# Patient Record
Sex: Female | Born: 1970 | Race: White | Hispanic: No | Marital: Married | Smoking: Never smoker
Health system: Southern US, Community
[De-identification: ages and names within clinical notes are randomized; demographics above are authoritative.]

## PROBLEM LIST (undated history)

## (undated) DIAGNOSIS — J302 Other seasonal allergic rhinitis: Secondary | ICD-10-CM

## (undated) DIAGNOSIS — B9681 Helicobacter pylori [H. pylori] as the cause of diseases classified elsewhere: Secondary | ICD-10-CM

## (undated) DIAGNOSIS — R51 Headache: Secondary | ICD-10-CM

## (undated) DIAGNOSIS — D649 Anemia, unspecified: Secondary | ICD-10-CM

## (undated) DIAGNOSIS — N200 Calculus of kidney: Secondary | ICD-10-CM

## (undated) DIAGNOSIS — K649 Unspecified hemorrhoids: Secondary | ICD-10-CM

## (undated) DIAGNOSIS — I639 Cerebral infarction, unspecified: Secondary | ICD-10-CM

## (undated) DIAGNOSIS — K289 Gastrojejunal ulcer, unspecified as acute or chronic, without hemorrhage or perforation: Secondary | ICD-10-CM

## (undated) DIAGNOSIS — N6019 Diffuse cystic mastopathy of unspecified breast: Secondary | ICD-10-CM

## (undated) DIAGNOSIS — A029 Salmonella infection, unspecified: Secondary | ICD-10-CM

## (undated) HISTORY — PX: NOVASURE ABLATION: SHX5394

## (undated) HISTORY — PX: LITHOTRIPSY: SUR834

## (undated) HISTORY — DX: Calculus of kidney: N20.0

## (undated) HISTORY — DX: Diffuse cystic mastopathy of unspecified breast: N60.19

## (undated) HISTORY — DX: Other seasonal allergic rhinitis: J30.2

## (undated) HISTORY — DX: Unspecified hemorrhoids: K64.9

## (undated) HISTORY — DX: Cerebral infarction, unspecified: I63.9

## (undated) HISTORY — DX: Headache: R51

## (undated) HISTORY — DX: Salmonella infection, unspecified: A02.9

## (undated) HISTORY — DX: Helicobacter pylori (H. pylori) as the cause of diseases classified elsewhere: B96.81

## (undated) HISTORY — DX: Gastrojejunal ulcer, unspecified as acute or chronic, without hemorrhage or perforation: K28.9

## (undated) HISTORY — PX: TUBAL LIGATION: SHX77

---

## 2005-04-01 ENCOUNTER — Observation Stay: Payer: Self-pay | Admitting: Obstetrics and Gynecology

## 2005-04-27 ENCOUNTER — Inpatient Hospital Stay: Payer: Self-pay | Admitting: Obstetrics and Gynecology

## 2006-08-15 ENCOUNTER — Emergency Department: Payer: Self-pay | Admitting: Emergency Medicine

## 2007-04-14 ENCOUNTER — Emergency Department: Payer: Self-pay | Admitting: Internal Medicine

## 2008-07-06 ENCOUNTER — Ambulatory Visit: Payer: Self-pay

## 2008-12-11 ENCOUNTER — Emergency Department: Payer: Self-pay | Admitting: Emergency Medicine

## 2008-12-24 ENCOUNTER — Ambulatory Visit: Payer: Self-pay | Admitting: Gastroenterology

## 2008-12-31 ENCOUNTER — Ambulatory Visit: Payer: Self-pay | Admitting: Gastroenterology

## 2009-02-23 DIAGNOSIS — G459 Transient cerebral ischemic attack, unspecified: Secondary | ICD-10-CM

## 2009-02-23 HISTORY — DX: Transient cerebral ischemic attack, unspecified: G45.9

## 2009-08-27 ENCOUNTER — Ambulatory Visit: Payer: Self-pay | Admitting: Gastroenterology

## 2009-09-06 ENCOUNTER — Ambulatory Visit: Payer: Self-pay | Admitting: Gastroenterology

## 2009-09-10 LAB — PATHOLOGY REPORT

## 2009-11-11 ENCOUNTER — Inpatient Hospital Stay: Payer: Self-pay | Admitting: Internal Medicine

## 2009-11-19 ENCOUNTER — Encounter: Payer: Self-pay | Admitting: Neurology

## 2009-11-23 ENCOUNTER — Encounter: Payer: Self-pay | Admitting: Neurology

## 2010-03-18 ENCOUNTER — Ambulatory Visit: Payer: Self-pay | Admitting: Internal Medicine

## 2010-05-13 ENCOUNTER — Ambulatory Visit: Payer: Self-pay | Admitting: Neurology

## 2010-08-01 ENCOUNTER — Ambulatory Visit: Payer: Self-pay | Admitting: Gastroenterology

## 2010-11-19 ENCOUNTER — Other Ambulatory Visit: Payer: Self-pay | Admitting: Gastroenterology

## 2010-12-02 ENCOUNTER — Ambulatory Visit: Payer: Self-pay | Admitting: Gastroenterology

## 2010-12-04 LAB — PATHOLOGY REPORT

## 2010-12-12 ENCOUNTER — Ambulatory Visit: Payer: Self-pay | Admitting: Gastroenterology

## 2011-07-14 ENCOUNTER — Ambulatory Visit: Payer: Self-pay | Admitting: Obstetrics and Gynecology

## 2012-07-26 ENCOUNTER — Ambulatory Visit: Payer: Self-pay

## 2012-07-27 ENCOUNTER — Ambulatory Visit: Payer: Self-pay

## 2012-08-09 ENCOUNTER — Ambulatory Visit: Payer: Self-pay | Admitting: Surgery

## 2012-08-16 ENCOUNTER — Ambulatory Visit: Payer: Self-pay | Admitting: Surgery

## 2012-08-16 HISTORY — PX: BREAST BIOPSY: SHX20

## 2012-09-11 ENCOUNTER — Emergency Department: Payer: Self-pay | Admitting: Emergency Medicine

## 2013-08-03 DIAGNOSIS — I639 Cerebral infarction, unspecified: Secondary | ICD-10-CM | POA: Insufficient documentation

## 2013-12-22 ENCOUNTER — Ambulatory Visit: Payer: Self-pay | Admitting: Physician Assistant

## 2014-02-14 ENCOUNTER — Ambulatory Visit: Payer: Self-pay

## 2014-07-13 ENCOUNTER — Other Ambulatory Visit: Payer: Self-pay | Admitting: Internal Medicine

## 2014-07-13 DIAGNOSIS — R1031 Right lower quadrant pain: Secondary | ICD-10-CM

## 2014-07-20 ENCOUNTER — Ambulatory Visit
Admission: RE | Admit: 2014-07-20 | Discharge: 2014-07-20 | Disposition: A | Discharge status: Home / Self Care | Payer: BLUE CROSS/BLUE SHIELD | Source: Ambulatory Visit | Attending: Internal Medicine | Admitting: Internal Medicine

## 2014-07-20 DIAGNOSIS — N839 Noninflammatory disorder of ovary, fallopian tube and broad ligament, unspecified: Secondary | ICD-10-CM | POA: Diagnosis not present

## 2014-07-20 DIAGNOSIS — R1031 Right lower quadrant pain: Secondary | ICD-10-CM | POA: Insufficient documentation

## 2014-07-20 DIAGNOSIS — Z0000 Encounter for general adult medical examination without abnormal findings: Secondary | ICD-10-CM | POA: Diagnosis present

## 2014-07-20 MED ORDER — IOHEXOL 300 MG/ML  SOLN
100.0000 mL | Freq: Once | INTRAMUSCULAR | Status: AC | PRN
  Administered 2014-07-20: 100 mL via INTRAVENOUS

## 2014-09-17 ENCOUNTER — Ambulatory Visit (INDEPENDENT_AMBULATORY_CARE_PROVIDER_SITE_OTHER): Payer: Self-pay | Admitting: Urology

## 2014-09-17 ENCOUNTER — Encounter: Payer: Self-pay | Admitting: Urology

## 2014-09-17 VITALS — BP 113/78 | HR 88 | Resp 18 | Ht 63.0 in | Wt 132.3 lb

## 2014-09-17 DIAGNOSIS — B9681 Helicobacter pylori [H. pylori] as the cause of diseases classified elsewhere: Secondary | ICD-10-CM

## 2014-09-17 DIAGNOSIS — J302 Other seasonal allergic rhinitis: Secondary | ICD-10-CM

## 2014-09-17 DIAGNOSIS — R310 Gross hematuria: Secondary | ICD-10-CM | POA: Insufficient documentation

## 2014-09-17 DIAGNOSIS — R51 Headache: Secondary | ICD-10-CM

## 2014-09-17 DIAGNOSIS — K649 Unspecified hemorrhoids: Secondary | ICD-10-CM

## 2014-09-17 DIAGNOSIS — K289 Gastrojejunal ulcer, unspecified as acute or chronic, without hemorrhage or perforation: Secondary | ICD-10-CM

## 2014-09-17 DIAGNOSIS — A029 Salmonella infection, unspecified: Secondary | ICD-10-CM

## 2014-09-17 DIAGNOSIS — R109 Unspecified abdominal pain: Secondary | ICD-10-CM

## 2014-09-17 DIAGNOSIS — R519 Headache, unspecified: Secondary | ICD-10-CM

## 2014-09-17 DIAGNOSIS — N6019 Diffuse cystic mastopathy of unspecified breast: Secondary | ICD-10-CM

## 2014-09-17 DIAGNOSIS — N200 Calculus of kidney: Secondary | ICD-10-CM

## 2014-09-17 HISTORY — DX: Headache, unspecified: R51.9

## 2014-09-17 HISTORY — DX: Salmonella infection, unspecified: A02.9

## 2014-09-17 HISTORY — DX: Diffuse cystic mastopathy of unspecified breast: N60.19

## 2014-09-17 HISTORY — DX: Other seasonal allergic rhinitis: J30.2

## 2014-09-17 HISTORY — DX: Helicobacter pylori (H. pylori) as the cause of diseases classified elsewhere: B96.81

## 2014-09-17 HISTORY — DX: Calculus of kidney: N20.0

## 2014-09-17 HISTORY — DX: Unspecified hemorrhoids: K64.9

## 2014-09-17 LAB — URINALYSIS, COMPLETE
BILIRUBIN UA: NEGATIVE
GLUCOSE, UA: NEGATIVE
KETONES UA: NEGATIVE
Leukocytes, UA: NEGATIVE
NITRITE UA: NEGATIVE
PH UA: 7 (ref 5.0–7.5)
PROTEIN UA: NEGATIVE
SPEC GRAV UA: 1.02 (ref 1.005–1.030)
Urobilinogen, Ur: 0.2 mg/dL (ref 0.2–1.0)

## 2014-09-17 LAB — MICROSCOPIC EXAMINATION
Bacteria, UA: NONE SEEN
RBC, UA: >30 /hpf — AB (ref 0–?)
WBC UA: NONE SEEN /HPF (ref 0–?)

## 2014-09-17 MED ORDER — TAMSULOSIN HCL 0.4 MG PO CAPS: 0.4000 mg | ORAL_CAPSULE | Freq: Every day | ORAL | Status: DC

## 2014-09-17 NOTE — Progress Notes (Signed)
09/17/2014 3:43 PM   Susan Cummings 09-19-1970 [id number]  Referring provider: Barbette Reichmann, MD [address] [place], [place] [address]  Chief Complaint  Patient presents with  . Nephrolithiasis    HPI: Susan Cummings is a 44 year old white female who presents today with the complaint of nephrolithiasis.  The patient states about 3 ago she was having a low-grade fever and passed a stone.  Two, weeks ago she started experiencing a lower right groin pain and a distinct right-sided flank pain. She is also been having urgency, frequency and gross hematuria. The groin and flank pain or intermittent in nature, but the urge to urinate is constant and severe.  She was having abdominal complaints in May and a CT scan with contrast was performed but no nephrolithiasis was identified in that study.   She has not had a 24-hour urine for stone analysis. She has been able to pass her previous stone spontaneously. Her UA today was positive for greater than 30 RBC's per high-power field. She denied current fever or chills and a teen. She is experiencing nausea.  He shouldn't is scheduled to be out of town for medical conference and her flight leaves on August 17.   PMH: Past Medical History  Diagnosis Date  . Cerebral vascular accident 08/03/2013  . Hemorrhoid 09/17/2014  . Allergic rhinitis, seasonal 09/17/2014  . Gastrointestinal ulcer due to Helicobacter pylori 09/17/2014  . Calculus of kidney 09/17/2014  . 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 Cephalalgia 09/17/2014  . Infection due to salmonella 09/17/2014    Surgical History: Past Surgical History  Procedure Laterality Date  . Lithotripsy    . Cesarean section      Home Medications:    Medication List       This list is accurate as of: 09/17/14  3:43 PM.  Always use your most recent med list.               acetaminophen 325 MG tablet  Commonly known as:  TYLENOL  Take by mouth.     ALPRAZolam 0.5 MG tablet    Commonly known as:  XANAX     aspirin EC 81 MG tablet  Take by mouth.     dicyclomine 10 MG capsule  Commonly known as:  BENTYL     EXCEDRIN MIGRAINE PO  Take by mouth.     MULTI-VITAMINS Tabs  Take by mouth.     oxyCODONE-acetaminophen 5-325 MG per tablet  Commonly known as:  PERCOCET/ROXICET        Allergies:  Allergies  Allergen Reactions  . Simvastatin Other (See Comments)    bruising  . Cefdinir Rash  . Penicillins Rash    Family History: Family History  Problem Relation Age of Onset  . Hypertension Mother   . Diabetes Maternal Grandmother     Social History:  reports that she has never smoked. She does not have any smokeless tobacco history on file. She reports that she drinks alcohol. She reports that she does not use illicit drugs.  ROS: UROLOGY Frequent Urination?: Yes Hard to postpone urination?: No Burning/pain with urination?: No Get up at night to urinate?: Yes Leakage of urine?: No Urine stream starts and stops?: No Trouble starting stream?: No Do you have to strain to urinate?: No Blood in urine?: Yes Urinary tract infection?: No Sexually transmitted disease?: No Injury to kidneys or bladder?: No Painful intercourse?: No Weak stream?: No Currently pregnant?: No Vaginal bleeding?: No Last menstrual period?: n  Gastrointestinal Nausea?: Yes Vomiting?: No Indigestion/heartburn?: No Diarrhea?: No Constipation?: No  Constitutional Fever: No Night sweats?: No Weight loss?: No Fatigue?: No  Skin Skin rash/lesions?: No Itching?: No  Eyes Blurred vision?: No Double vision?: No  Ears/Nose/Throat Sore throat?: No Sinus problems?: No  Hematologic/Lymphatic Swollen glands?: No Easy bruising?: No  Cardiovascular Leg swelling?: No Chest pain?: No  Respiratory Cough?: No Shortness of breath?: No  Endocrine Excessive thirst?: No  Musculoskeletal Back pain?: No Joint pain?: No  Neurological Headaches?:  No Dizziness?: No  Psychologic Depression?: No Anxiety?: No  Physical Exam: BP 113/78 mmHg  Pulse 88  Resp 18  Ht 5\' 3"  (1.6 m)  Wt 132 lb 4.8 oz (60.011 kg)  BMI 23.44 kg/m2  Constitutional:  Alert and oriented, No acute distress. HEENT: [place] AT, moist mucus membranes.  Trachea midline, no masses. Cardiovascular: No clubbing, cyanosis, or edema. Respiratory: Normal respiratory effort, no increased work of breathing. GI: Abdomen is soft, nontender, nondistended, no abdominal masses GU: Mild right CVA tenderness. Skin: No rashes, bruises or suspicious lesions. Lymph: No cervical or inguinal adenopathy. Neurologic: Grossly intact, no focal deficits, moving all 4 extremities. Psychiatric: Normal mood and affect.  Laboratory Data: Results for orders placed or performed in visit on 09/17/14  Microscopic Examination  Result Value Ref Range   WBC, UA None seen 0 -  5 /hpf   RBC, UA >30 (A) 0 -  2 /hpf   Epithelial Cells (non renal) 0-10 0 - 10 /hpf   Bacteria, UA None seen None seen/Few  Urinalysis, Complete  Result Value Ref Range   Specific Gravity, UA 1.020 1.005 - 1.030   pH, UA 7.0 5.0 - 7.5   Color, UA Yellow Yellow   Appearance Ur Clear Clear   Leukocytes, UA Negative Negative   Protein, UA Negative Negative/Trace   Glucose, UA Negative Negative   Ketones, UA Negative Negative   RBC, UA 2+ (A) Negative   Bilirubin, UA Negative Negative   Urobilinogen, Ur 0.2 0.2 - 1.0 mg/dL   Nitrite, UA Negative Negative   Microscopic Examination See below:      No results found for: WBC, HGB, HCT, MCV, PLT  No results found for: CREATININE  No results found for: PSA  No results found for: TESTOSTERONE  No results found for: HGBA1C  Urinalysis No results found for: COLORURINE, APPEARANCEUR, LABSPEC, PHURINE, GLUCOSEU, HGBUR, BILIRUBINUR, KETONESUR, PROTEINUR, UROBILINOGEN, NITRITE, LEUKOCYTESUR  Pertinent Imaging: CLINICAL DATA: Right lower quadrant abdominal pain with  nausea and vomiting for 2 weeks.  EXAM: CT ABDOMEN AND PELVIS WITH CONTRAST  TECHNIQUE: Multidetector CT imaging of the abdomen and pelvis was performed using the standard protocol following bolus administration of intravenous contrast.  CONTRAST: OMNIPAQUE IOHEXOL 300 MG/ML SOLN  COMPARISON: 12/11/2008, 12/22/2013  FINDINGS: Lower chest: Clear lung bases. Normal heart size. No pericardial or pleural effusion.  Abdomen: Liver, gallbladder, biliary system, pancreas, spleen, adrenal glands, and kidneys are within normal limits for age and demonstrate no acute process.  Negative for bowel obstruction, dilatation, ileus, or free air.  No abdominal free fluid, fluid collection, hemorrhage, abscess, or adenopathy.  Normal appendix in the right lower quadrant.  Pelvis: Moderately distended urinary bladder. Uterus is normal in size. Small amount of fluid within the endometrial cavity, nonspecific. Ovaries are normal in size. Right ovary demonstrates a peripherally enhancing minimally irregular cyst measuring 18 x 17 mm compatible with a collapsing cyst or follicle. Trace pelvic free fluid, likely physiologic. No pelvic free fluid, fluid  collection, hemorrhage, abscess, adenopathy, inguinal abnormality, or hernia.  No acute osseous finding.  IMPRESSION: No acute intra-abdominal or pelvic finding.  Normal appendix  18 mm right ovarian peripherally enhancing collapsing cyst or follicle.   Electronically Signed  By: Judie Petit. Shick M.D.  On: 07/20/2014 15:00        Assessment & Plan:    1. Flank pain:  Patient with right-sided flank pain and gross hematuria. We'll schedule low-dose noncontrast CT to look for renal stones and effort to reduce radiation exposure for the patient. She had undergone a CT scan with contrast in May of this year.  - Urinalysis, Complete  2. Passage of stone:   I have prescribed Flomax in order to facilitate medical  expulsion of any remaining stone fragments she may have. CT scan is pending.  3. Gross hematuria:   We will continue to monitor the patient's urine for hematuria.  If stone is not found on noncontrast CT, we will pursue hematuria workup.   No Follow-up on file.  Michiel Cowboy, PA-C  [facility] [address], [address] [place], [place] [address] [phone]

## 2014-09-19 ENCOUNTER — Ambulatory Visit
Admission: RE | Admit: 2014-09-19 | Discharge: 2014-09-19 | Disposition: A | Discharge status: Home / Self Care | Payer: BLUE CROSS/BLUE SHIELD | Source: Ambulatory Visit | Attending: Urology | Admitting: Urology

## 2014-09-19 DIAGNOSIS — N200 Calculus of kidney: Secondary | ICD-10-CM | POA: Insufficient documentation

## 2014-09-19 DIAGNOSIS — K5900 Constipation, unspecified: Secondary | ICD-10-CM | POA: Diagnosis not present

## 2014-09-19 DIAGNOSIS — M4806 Spinal stenosis, lumbar region: Secondary | ICD-10-CM | POA: Insufficient documentation

## 2014-09-19 DIAGNOSIS — M5136 Other intervertebral disc degeneration, lumbar region: Secondary | ICD-10-CM | POA: Insufficient documentation

## 2014-09-20 ENCOUNTER — Telehealth: Payer: Self-pay

## 2014-09-20 NOTE — Telephone Encounter (Signed)
Spoke with pt and made aware of stones and to push fluids. Pt voiced understanding. Pt was transferred to the front to make f/u appt.

## 2014-09-20 NOTE — Telephone Encounter (Signed)
-----   Message from Harle Battiest, PA-C sent at 09/20/2014  8:47 AM EDT ----- Patient has two tiny stones about to go into the bladder on the left side.  She should push fluids, take the Flomax and strain the urine to capture the stones.  I would like to see her next week.

## 2014-09-25 ENCOUNTER — Ambulatory Visit: Payer: BLUE CROSS/BLUE SHIELD | Admitting: Urology

## 2015-01-02 ENCOUNTER — Encounter: Payer: Self-pay | Admitting: Emergency Medicine

## 2015-01-02 ENCOUNTER — Emergency Department: Payer: BLUE CROSS/BLUE SHIELD

## 2015-01-02 ENCOUNTER — Emergency Department
Admission: EM | Admit: 2015-01-02 | Discharge: 2015-01-02 | Disposition: A | Discharge status: Home / Self Care | Payer: BLUE CROSS/BLUE SHIELD | Attending: Emergency Medicine | Admitting: Emergency Medicine

## 2015-01-02 DIAGNOSIS — N888 Other specified noninflammatory disorders of cervix uteri: Secondary | ICD-10-CM

## 2015-01-02 DIAGNOSIS — R221 Localized swelling, mass and lump, neck: Secondary | ICD-10-CM | POA: Diagnosis not present

## 2015-01-02 DIAGNOSIS — R109 Unspecified abdominal pain: Secondary | ICD-10-CM

## 2015-01-02 DIAGNOSIS — R1030 Lower abdominal pain, unspecified: Secondary | ICD-10-CM | POA: Diagnosis present

## 2015-01-02 DIAGNOSIS — R112 Nausea with vomiting, unspecified: Secondary | ICD-10-CM | POA: Diagnosis not present

## 2015-01-02 DIAGNOSIS — R197 Diarrhea, unspecified: Secondary | ICD-10-CM | POA: Diagnosis not present

## 2015-01-02 DIAGNOSIS — Z79899 Other long term (current) drug therapy: Secondary | ICD-10-CM | POA: Insufficient documentation

## 2015-01-02 DIAGNOSIS — R1013 Epigastric pain: Secondary | ICD-10-CM | POA: Diagnosis not present

## 2015-01-02 DIAGNOSIS — Z880 Allergy status to penicillin: Secondary | ICD-10-CM | POA: Diagnosis not present

## 2015-01-02 LAB — URINALYSIS COMPLETE WITH MICROSCOPIC (ARMC ONLY)
BILIRUBIN URINE: NEGATIVE
Bacteria, UA: NONE SEEN
Glucose, UA: NEGATIVE mg/dL
Hgb urine dipstick: NEGATIVE
KETONES UR: NEGATIVE mg/dL
Leukocytes, UA: NEGATIVE
NITRITE: NEGATIVE
Protein, ur: NEGATIVE mg/dL
RBC / HPF: NONE SEEN RBC/hpf (ref 0–5)
Specific Gravity, Urine: 1.021 (ref 1.005–1.030)
pH: 5 (ref 5.0–8.0)

## 2015-01-02 LAB — COMPREHENSIVE METABOLIC PANEL
ALBUMIN: 4.2 g/dL (ref 3.5–5.0)
ALK PHOS: 46 U/L (ref 38–126)
ALT: 26 U/L (ref 14–54)
ANION GAP: 6 (ref 5–15)
AST: 26 U/L (ref 15–41)
BILIRUBIN TOTAL: 1.7 mg/dL — AB (ref 0.3–1.2)
BUN: 11 mg/dL (ref 6–20)
CALCIUM: 9.2 mg/dL (ref 8.9–10.3)
CO2: 29 mmol/L (ref 22–32)
Chloride: 105 mmol/L (ref 101–111)
Creatinine, Ser: 0.97 mg/dL (ref 0.44–1.00)
GLUCOSE: 109 mg/dL — AB (ref 65–99)
Potassium: 4.2 mmol/L (ref 3.5–5.1)
Sodium: 140 mmol/L (ref 135–145)
TOTAL PROTEIN: 6.6 g/dL (ref 6.5–8.1)

## 2015-01-02 LAB — CBC
HCT: 41.2 % (ref 35.0–47.0)
HEMOGLOBIN: 13.4 g/dL (ref 12.0–16.0)
MCH: 27.3 pg (ref 26.0–34.0)
MCHC: 32.6 g/dL (ref 32.0–36.0)
MCV: 83.6 fL (ref 80.0–100.0)
Platelets: 254 10*3/uL (ref 150–440)
RBC: 4.92 MIL/uL (ref 3.80–5.20)
RDW: 16.2 % — AB (ref 11.5–14.5)
WBC: 18.8 10*3/uL — ABNORMAL HIGH (ref 3.6–11.0)

## 2015-01-02 LAB — LIPASE, BLOOD: Lipase: 25 U/L (ref 11–51)

## 2015-01-02 MED ORDER — IOHEXOL 240 MG/ML SOLN
25.0000 mL | Freq: Once | INTRAMUSCULAR | Status: AC | PRN
  Administered 2015-01-02: 25 mL via INTRAVENOUS
  Filled 2015-01-02: qty 25

## 2015-01-02 MED ORDER — SODIUM CHLORIDE 0.9 % IV BOLUS (SEPSIS)
1000.0000 mL | Freq: Once | INTRAVENOUS | Status: AC
Start: 2015-01-02 — End: 2015-01-02
  Administered 2015-01-02: 1000 mL via INTRAVENOUS

## 2015-01-02 MED ORDER — ONDANSETRON HCL 4 MG/2ML IJ SOLN
4.0000 mg | Freq: Once | INTRAMUSCULAR | Status: AC
  Administered 2015-01-02: 4 mg via INTRAVENOUS
  Filled 2015-01-02: qty 2

## 2015-01-02 MED ORDER — OXYCODONE-ACETAMINOPHEN 5-325 MG PO TABS: 1.0000 | ORAL_TABLET | Freq: Four times a day (QID) | ORAL | Status: DC | PRN

## 2015-01-02 MED ORDER — IOHEXOL 300 MG/ML  SOLN
100.0000 mL | Freq: Once | INTRAMUSCULAR | Status: AC | PRN
  Administered 2015-01-02: 100 mL via INTRAVENOUS
  Filled 2015-01-02: qty 100

## 2015-01-02 NOTE — ED Provider Notes (Signed)
[facility] Emergency Department Provider Note  Time seen: 2:13 PM  I have reviewed the triage vital signs and the nursing notes.   HISTORY  Chief Complaint Abdominal Pain    HPI Susan Cummings is a 44 y.o. female with a past medical history of allergies, presents to the emergency department with abdominal pain and diarrhea and vomiting. According to the patient for the past 5 weeks she's had intermittent abdominal pain sometimes in the epigastrium however this episode is located in the left abdomen, and lower abdomen (contrary to triage note). Patient states significant discomfort since last night, describes her pain as a dull crampy pain in the left side of her abdomen, epigastrium, and lower abdomen. Denies any right upper quadrant pain. States she has had similar pains in the past and she has had her gallbladder evaluated twice with a HIDA scan both of which showing sufficient gallbladder contraction, per patient. Patient states diarrhea but denies any black or bloody stool. States she had a low-grade fever 2 weeks ago, but denies any current fever.     Past Medical History  Diagnosis Date  . Cerebral vascular accident (HCC) 08/03/2013  . Hemorrhoid 09/17/2014  . Allergic rhinitis, seasonal 09/17/2014  . Gastrointestinal ulcer due to Helicobacter pylori 09/17/2014  . Calculus of kidney 09/17/2014  . 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 Cephalalgia 09/17/2014  . Infection due to salmonella 09/17/2014    Patient Active Problem List   Diagnosis Date Noted  . 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 Gastrointestinal ulcer due to Helicobacter pylori 09/17/2014  . Cephalalgia 09/17/2014  . Hemorrhoid 09/17/2014  . Calculus of kidney 09/17/2014  . Infection due to salmonella 09/17/2014  . Allergic rhinitis, seasonal 09/17/2014  . Nephrolithiasis 09/17/2014  . Flank pain 09/17/2014  . Gross hematuria 09/17/2014  . Cerebral vascular accident [facility](HCC) 08/03/2013    Past  Surgical History  Procedure Laterality Date  . Lithotripsy    . Cesarean section      Current Outpatient Rx  Name  Route  Sig  Dispense  Refill  . acetaminophen (TYLENOL) 325 MG tablet   Oral   Take by mouth.         . ALPRAZolam (XANAX) 0.5 MG tablet            2   . aspirin EC 81 MG tablet   Oral   Take by mouth.         . Aspirin-Acetaminophen-Caffeine (EXCEDRIN MIGRAINE PO)   Oral   Take by mouth.         . dicyclomine (BENTYL) 10 MG capsule            4   . Multiple Vitamin (MULTI-VITAMINS) TABS   Oral   Take by mouth.         . oxyCODONE-acetaminophen (PERCOCET/ROXICET) 5-325 MG per tablet            0   . tamsulosin (FLOMAX) 0.4 MG CAPS capsule   Oral   Take 1 capsule (0.4 mg total) by mouth daily.   30 capsule   12     Allergies Simvastatin; Cefdinir; and Penicillins  Family History  Problem Relation Age of Onset  . Hypertension Mother   . Diabetes Maternal Grandmother     Social History Social History  Substance Use Topics  . Smoking status: Never Smoker   . Smokeless tobacco: None  . Alcohol Use: 0.0 oz/week    0 Standard drinks or equivalent per week  Review of Systems Constitutional: Negative for fever. Cardiovascular: Negative for chest pain. Respiratory: Negative for shortness of breath. Gastrointestinal: Positive for abdominal pain, nausea, vomiting, diarrhea. Genitourinary: Negative for dysuria. Musculoskeletal: Negative for back pain. Neurological: Negative for headache 10-point ROS otherwise negative.  ____________________________________________   PHYSICAL EXAM:  VITAL SIGNS: ED Triage Vitals  Enc Vitals Group     BP 01/02/15 1116 127/66 mmHg     Pulse Rate 01/02/15 1116 100     Resp 01/02/15 1116 20     Temp 01/02/15 1116 98.5 F (36.9 C)     Temp Source 01/02/15 1116 Oral     SpO2 01/02/15 1116 100 %     Weight 01/02/15 1116 132 lb (59.875 kg)     Height 01/02/15 1116  (1.6 m)     Head  Cir --      Peak Flow --      Pain Score 01/02/15 1115 4     Pain Loc --      Pain Edu? --      Excl. in GC? --     Constitutional: Alert and oriented. Well appearing and in no distress. Eyes: Normal exam ENT   Head: Normocephalic and atraumatic.   Mouth/Throat: Mucous membranes are moist. Cardiovascular: Normal rate, regular rhythm. No murmur Respiratory: Normal respiratory effort without tachypnea nor retractions. Breath sounds are clear and equal bilaterally. No wheezes/rales/rhonchi. Gastrointestinal: Soft, moderate left-sided abdominal tenderness palpation. Mild epigastric and lower abdominal tenderness palpation. No rebound or guarding. Musculoskeletal: Nontender with normal range of motion in all extremities. Neurologic:  Normal speech and language. No gross focal neurologic deficits  Skin:  Skin is warm, dry and intact.  Psychiatric: Mood and affect are normal. Speech and behavior are normal.   ____________________________________________   RADIOLOGY  CT shows 5 x 5 cm likely cervical mass.  Ultrasound negative  ____________________________________________    INITIAL IMPRESSION / ASSESSMENT AND PLAN / ED COURSE  Pertinent labs & imaging results that were available during my care of the patient were reviewed by me and considered in my medical decision making (see chart for details).  moderate left-sided abdominal tenderness palpation, diarrhea, nausea and vomiting. Given her elevated white blood cell count of 18 we'll proceed with a CT abdomen/pelvis to further evaluate. Patient has no right upper quadrant tenderness to palpation on exam. Suspect more likely diverticulitis/colitis.  CT shows likely cervical mass. We will discuss with OB/GYN patient had a NovaSure procedure approximately one year ago.. The patient has noted for the past 2-3 weeks some intermittent vaginal spotting. Patient's labs have also resulted showing a slightly elevated total bilirubin of  1.7 compared to her baseline of 1.0. We will proceed with a right upper quadrant ultrasound to further evaluate.  Discussed the patient with Dr. Feliberto Gottron who states the patient is safe for discharge with outpatient follow-up for biopsy. Discussed with the patient she will follow-up with Dr. Dalbert Garnet.  Ultrasound negative. Patient will follow-up with Dr. Dalbert Garnet. We'll discharge him with pain medication. Patient agreeable to plan.   __________________________________________   FINAL CLINICAL IMPRESSION(S) / ED DIAGNOSES  Abdominal pain Diarrhea Cervical mass    Minna Antis, MD 01/02/15 [phone]

## 2015-01-02 NOTE — ED Notes (Signed)
Presents with right upper quad pain which radiates into back  Positive n/v

## 2015-01-02 NOTE — Discharge Instructions (Signed)
Please follow-up with Dr. Dalbert GarnetBeasley by calling the number provided as soon as possible for further workup and evaluation of the cervical mass. Return to the emergency department for any worsening abdominal pain, fever, or any other symptom personally concerning to your self. Please take your pain medication as needed, as prescribed.    Abdominal Pain, Adult Many things can cause abdominal pain. Usually, abdominal pain is not caused by a disease and will improve without treatment. It can often be observed and treated at home. Your health care provider will do a physical exam and possibly order blood tests and X-rays to help determine the seriousness of your pain. However, in many cases, more time must pass before a clear cause of the pain can be found. Before that point, your health care provider may not know if you need more testing or further treatment. HOME CARE INSTRUCTIONS Monitor your abdominal pain for any changes. The following actions may help to alleviate any discomfort you are experiencing:  Only take over-the-counter or prescription medicines as directed by your health care provider.  Do not take laxatives unless directed to do so by your health care provider.  Try a clear liquid diet (broth, tea, or water) as directed by your health care provider. Slowly move to a bland diet as tolerated. SEEK MEDICAL CARE IF:  You have unexplained abdominal pain.  You have abdominal pain associated with nausea or diarrhea.  You have pain when you urinate or have a bowel movement.  You experience abdominal pain that wakes you in the night.  You have abdominal pain that is worsened or improved by eating food.  You have abdominal pain that is worsened with eating fatty foods.  You have a fever. SEEK IMMEDIATE MEDICAL CARE IF:  Your pain does not go away within 2 hours.  You keep throwing up (vomiting).  Your pain is felt only in portions of the abdomen, such as the right side or the left  lower portion of the abdomen.  You pass bloody or black tarry stools. MAKE SURE YOU:  Understand these instructions.  Will watch your condition.  Will get help right away if you are not doing well or get worse.   This information is not intended to replace advice given to you by your health care provider. Make sure you discuss any questions you have with your health care provider.   Document Released: 11/19/2004 Document Revised: 10/31/2014 Document Reviewed: 10/19/2012 Elsevier Interactive Patient Education [employer]2016 [employer] Inc[employer]

## 2015-01-28 ENCOUNTER — Other Ambulatory Visit: Payer: Self-pay | Admitting: Obstetrics and Gynecology

## 2015-01-28 DIAGNOSIS — Z1231 Encounter for screening mammogram for malignant neoplasm of breast: Secondary | ICD-10-CM

## 2015-02-19 ENCOUNTER — Ambulatory Visit
Admission: RE | Admit: 2015-02-19 | Discharge: 2015-02-19 | Disposition: A | Discharge status: Home / Self Care | Payer: Managed Care, Other (non HMO) | Source: Ambulatory Visit | Attending: Obstetrics and Gynecology | Admitting: Obstetrics and Gynecology

## 2015-02-19 DIAGNOSIS — Z1231 Encounter for screening mammogram for malignant neoplasm of breast: Secondary | ICD-10-CM

## 2015-03-08 ENCOUNTER — Encounter: Payer: Self-pay | Admitting: *Deleted

## 2015-03-08 ENCOUNTER — Other Ambulatory Visit: Payer: BLUE CROSS/BLUE SHIELD

## 2015-03-08 NOTE — Patient Instructions (Signed)
  Your procedure is scheduled on: 03-14-15  Report to [facility] 2ND FLOOR To find out your arrival time please call [phone] between 1PM - 3PM on 03-13-15  Remember: Instructions that are not followed completely may result in serious medical risk, up to and including death, or upon the discretion of your surgeon and anesthesiologist your surgery may need to be rescheduled.    _X___ 1. Do not eat food or drink liquids after midnight. No gum chewing or hard candies.     _X___ 2. No Alcohol for 24 hours before or after surgery.   ____ 3. Bring all medications with you on the day of surgery if instructed.    _X___ 4. Notify your doctor if there is any change in your medical condition     (cold, fever, infections).     Do not wear jewelry, make-up, hairpins, clips or nail polish.  Do not wear lotions, powders, or perfumes. You may wear deodorant.  Do not shave 48 hours prior to surgery. Men may shave face and neck.  Do not bring valuables to the hospital.    [facility] is not responsible for any belongings or valuables.               Contacts, dentures or bridgework may not be worn into surgery.  Leave your suitcase in the car. After surgery it may be brought to your room.  For patients admitted to the hospital, discharge time is determined by your treatment team.   Patients discharged the day of surgery will not be allowed to drive home.   Please read over the following fact sheets that you were given:      ____ Take these medicines the morning of surgery with A SIP OF WATER:    1. NONE  2.   3.   4.  5.  6.  ____ Fleet Enema (as directed)   ____ Use CHG Soap as directed  ____ Use inhalers on the day of surgery  ____ Stop metformin 2 days prior to surgery    ____ Take 1/2 of usual insulin dose the night before surgery and none on the morning of surgery.   ____ Stop Coumadin/Plavix/aspirin-N/A  ____ Stop Anti-inflammatories-NO NSAIDS OR ASA  PRODUCTS-TYLENOL OK   ____ Stop supplements until after surgery.    ____ Bring C-Pap to the hospital.

## 2015-03-14 ENCOUNTER — Encounter
Admission: RE | Disposition: A | Discharge status: Home / Self Care | Payer: Self-pay | Source: Ambulatory Visit | Attending: Surgery

## 2015-03-14 ENCOUNTER — Ambulatory Visit
Admission: RE | Admit: 2015-03-14 | Discharge: 2015-03-14 | Disposition: A | Discharge status: Home / Self Care | Payer: Managed Care, Other (non HMO) | Source: Ambulatory Visit | Attending: Surgery | Admitting: Surgery

## 2015-03-14 ENCOUNTER — Ambulatory Visit: Payer: Managed Care, Other (non HMO) | Admitting: Anesthesiology

## 2015-03-14 ENCOUNTER — Ambulatory Visit: Payer: Managed Care, Other (non HMO)

## 2015-03-14 DIAGNOSIS — N6019 Diffuse cystic mastopathy of unspecified breast: Secondary | ICD-10-CM | POA: Insufficient documentation

## 2015-03-14 DIAGNOSIS — Z880 Allergy status to penicillin: Secondary | ICD-10-CM | POA: Insufficient documentation

## 2015-03-14 DIAGNOSIS — Z7982 Long term (current) use of aspirin: Secondary | ICD-10-CM | POA: Insufficient documentation

## 2015-03-14 DIAGNOSIS — Z8673 Personal history of transient ischemic attack (TIA), and cerebral infarction without residual deficits: Secondary | ICD-10-CM | POA: Diagnosis not present

## 2015-03-14 DIAGNOSIS — K811 Chronic cholecystitis: Secondary | ICD-10-CM | POA: Insufficient documentation

## 2015-03-14 DIAGNOSIS — Z79899 Other long term (current) drug therapy: Secondary | ICD-10-CM | POA: Diagnosis not present

## 2015-03-14 DIAGNOSIS — Z87442 Personal history of urinary calculi: Secondary | ICD-10-CM | POA: Diagnosis not present

## 2015-03-14 DIAGNOSIS — R51 Headache: Secondary | ICD-10-CM | POA: Insufficient documentation

## 2015-03-14 DIAGNOSIS — Z833 Family history of diabetes mellitus: Secondary | ICD-10-CM | POA: Diagnosis not present

## 2015-03-14 DIAGNOSIS — Z888 Allergy status to other drugs, medicaments and biological substances status: Secondary | ICD-10-CM | POA: Insufficient documentation

## 2015-03-14 DIAGNOSIS — Z801 Family history of malignant neoplasm of trachea, bronchus and lung: Secondary | ICD-10-CM | POA: Insufficient documentation

## 2015-03-14 DIAGNOSIS — Z8249 Family history of ischemic heart disease and other diseases of the circulatory system: Secondary | ICD-10-CM | POA: Insufficient documentation

## 2015-03-14 DIAGNOSIS — K819 Cholecystitis, unspecified: Secondary | ICD-10-CM

## 2015-03-14 HISTORY — DX: Anemia, unspecified: D64.9

## 2015-03-14 HISTORY — PX: CHOLECYSTECTOMY: SHX55

## 2015-03-14 LAB — POCT PREGNANCY, URINE: PREG TEST UR: NEGATIVE

## 2015-03-14 SURGERY — LAPAROSCOPIC CHOLECYSTECTOMY
Anesthesia: General | Wound class: Clean Contaminated

## 2015-03-14 MED ORDER — PHENYLEPHRINE HCL 10 MG/ML IJ SOLN
INTRAMUSCULAR | Status: DC | PRN
  Administered 2015-03-14 (×2): 100 ug via INTRAVENOUS

## 2015-03-14 MED ORDER — ACETAMINOPHEN 10 MG/ML IV SOLN
INTRAVENOUS | Status: DC | PRN
  Administered 2015-03-14: 1000 mg via INTRAVENOUS

## 2015-03-14 MED ORDER — HYDROCODONE-ACETAMINOPHEN 5-325 MG PO TABS: 1.0000 | ORAL_TABLET | ORAL | Status: DC | PRN

## 2015-03-14 MED ORDER — OXYCODONE HCL 5 MG/5ML PO SOLN: 5.0000 mg | Freq: Once | ORAL | Status: DC | PRN

## 2015-03-14 MED ORDER — FENTANYL CITRATE (PF) 100 MCG/2ML IJ SOLN
INTRAMUSCULAR | Status: DC | PRN
  Administered 2015-03-14: 100 ug via INTRAVENOUS
  Administered 2015-03-14: 25 ug via INTRAVENOUS

## 2015-03-14 MED ORDER — FAMOTIDINE 20 MG PO TABS
20.0000 mg | ORAL_TABLET | Freq: Once | ORAL | Status: AC
  Administered 2015-03-14: 20 mg via ORAL

## 2015-03-14 MED ORDER — PROPOFOL 10 MG/ML IV BOLUS
INTRAVENOUS | Status: DC | PRN
  Administered 2015-03-14: 120 mg via INTRAVENOUS

## 2015-03-14 MED ORDER — NEOSTIGMINE METHYLSULFATE 10 MG/10ML IV SOLN
INTRAVENOUS | Status: DC | PRN
  Administered 2015-03-14: 4 mg via INTRAVENOUS

## 2015-03-14 MED ORDER — ONDANSETRON HCL 4 MG/2ML IJ SOLN
INTRAMUSCULAR | Status: DC | PRN
  Administered 2015-03-14: 4 mg via INTRAVENOUS

## 2015-03-14 MED ORDER — GLYCOPYRROLATE 0.2 MG/ML IJ SOLN
INTRAMUSCULAR | Status: DC | PRN
  Administered 2015-03-14: 0.6 mg via INTRAVENOUS

## 2015-03-14 MED ORDER — HEPARIN SODIUM (PORCINE) 5000 UNIT/ML IJ SOLN
INTRAMUSCULAR | Status: AC
  Filled 2015-03-14: qty 1

## 2015-03-14 MED ORDER — DEXAMETHASONE SODIUM PHOSPHATE 10 MG/ML IJ SOLN
INTRAMUSCULAR | Status: DC | PRN
  Administered 2015-03-14: 8 mg via INTRAVENOUS

## 2015-03-14 MED ORDER — OXYCODONE HCL 5 MG PO TABS: 5.0000 mg | ORAL_TABLET | Freq: Once | ORAL | Status: DC | PRN

## 2015-03-14 MED ORDER — BUPIVACAINE-EPINEPHRINE (PF) 0.5% -1:200000 IJ SOLN
INTRAMUSCULAR | Status: DC | PRN
  Administered 2015-03-14: 20 mL via PERINEURAL

## 2015-03-14 MED ORDER — FENTANYL CITRATE (PF) 100 MCG/2ML IJ SOLN
25.0000 ug | INTRAMUSCULAR | Status: DC | PRN
  Administered 2015-03-14 (×3): 25 ug via INTRAVENOUS

## 2015-03-14 MED ORDER — FENTANYL CITRATE (PF) 100 MCG/2ML IJ SOLN
INTRAMUSCULAR | Status: AC
  Administered 2015-03-14: 25 ug via INTRAVENOUS
  Filled 2015-03-14: qty 2

## 2015-03-14 MED ORDER — LIDOCAINE HCL (CARDIAC) 20 MG/ML IV SOLN
INTRAVENOUS | Status: DC | PRN
  Administered 2015-03-14: 40 mg via INTRAVENOUS

## 2015-03-14 MED ORDER — BUPIVACAINE-EPINEPHRINE (PF) 0.5% -1:200000 IJ SOLN
INTRAMUSCULAR | Status: AC
  Filled 2015-03-14: qty 30

## 2015-03-14 MED ORDER — LACTATED RINGERS IV SOLN
INTRAVENOUS | Status: DC
  Administered 2015-03-14 (×2): via INTRAVENOUS

## 2015-03-14 MED ORDER — ROCURONIUM BROMIDE 100 MG/10ML IV SOLN
INTRAVENOUS | Status: DC | PRN
  Administered 2015-03-14: 30 mg via INTRAVENOUS
  Administered 2015-03-14: 10 mg via INTRAVENOUS

## 2015-03-14 MED ORDER — FAMOTIDINE 20 MG PO TABS
ORAL_TABLET | ORAL | Status: AC
  Filled 2015-03-14: qty 1

## 2015-03-14 MED ORDER — SODIUM CHLORIDE 0.9 % IJ SOLN
INTRAMUSCULAR | Status: AC
  Filled 2015-03-14: qty 50

## 2015-03-14 SURGICAL SUPPLY — 37 items
APPLIER CLIP ROT 10 11.4 M/L (STAPLE) ×3
CANISTER SUCT 1200ML W/VALVE (MISCELLANEOUS) ×3 IMPLANT
CANNULA DILATOR 10 W/SLV (CANNULA) ×2 IMPLANT
CANNULA DILATOR 10MM W/SLV (CANNULA) ×1
CATH REDDICK CHOLANGI 4FR 50CM (CATHETERS) ×3 IMPLANT
CHLORAPREP W/TINT 26ML (MISCELLANEOUS) ×3 IMPLANT
CLIP APPLIE ROT 10 11.4 M/L (STAPLE) ×1 IMPLANT
CLOSURE WOUND 1/2 X4 (GAUZE/BANDAGES/DRESSINGS) ×1
DRAPE SHEET LG 3/4 BI-LAMINATE (DRAPES) ×3 IMPLANT
ELECT REM PT RETURN 9FT ADLT (ELECTROSURGICAL) ×3
ELECTRODE REM PT RTRN 9FT ADLT (ELECTROSURGICAL) ×1 IMPLANT
GAUZE SPONGE 4X4 12PLY STRL (GAUZE/BANDAGES/DRESSINGS) ×3 IMPLANT
GLOVE BIO SURGEON STRL SZ7.5 (GLOVE) ×3 IMPLANT
GOWN STRL REUS W/ TWL LRG LVL3 (GOWN DISPOSABLE) ×4 IMPLANT
GOWN STRL REUS W/TWL LRG LVL3 (GOWN DISPOSABLE) ×8
IRRIGATION STRYKERFLOW (MISCELLANEOUS) ×1 IMPLANT
IRRIGATOR STRYKERFLOW (MISCELLANEOUS) ×3
IV NS 1000ML (IV SOLUTION) ×2
IV NS 1000ML BAXH (IV SOLUTION) ×1 IMPLANT
KIT RM TURNOVER STRD PROC AR (KITS) ×3 IMPLANT
LABEL OR SOLS (LABEL) ×3 IMPLANT
NDL INSUFF ACCESS 14 VERSASTEP (NEEDLE) ×3 IMPLANT
NEEDLE FILTER BLUNT 18X 1/2SAF (NEEDLE) ×2
NEEDLE FILTER BLUNT 18X1 1/2 (NEEDLE) ×1 IMPLANT
NS IRRIG 500ML POUR BTL (IV SOLUTION) ×3 IMPLANT
PACK LAP CHOLECYSTECTOMY (MISCELLANEOUS) ×3 IMPLANT
SCISSORS METZENBAUM CVD 33 (INSTRUMENTS) ×3 IMPLANT
SEAL FOR SCOPE WARMER C3101 (MISCELLANEOUS) IMPLANT
SLEEVE ENDOPATH XCEL 5M (ENDOMECHANICALS) ×3 IMPLANT
STRIP CLOSURE SKIN 1/2X4 (GAUZE/BANDAGES/DRESSINGS) ×2 IMPLANT
SUT CHROMIC 5 0 RB 1 27 (SUTURE) ×3 IMPLANT
SUT VIC AB 0 CT2 27 (SUTURE) IMPLANT
SYR 3ML LL SCALE MARK (SYRINGE) ×3 IMPLANT
TROCAR XCEL NON-BLD 11X100MML (ENDOMECHANICALS) ×3 IMPLANT
TROCAR XCEL NON-BLD 5MMX100MML (ENDOMECHANICALS) ×3 IMPLANT
TUBING INSUFFLATOR HI FLOW (MISCELLANEOUS) ×3 IMPLANT
WATER STERILE IRR 1000ML POUR (IV SOLUTION) ×3 IMPLANT

## 2015-03-14 NOTE — Transfer of Care (Signed)
Immediate Anesthesia Transfer of Care Note  Patient: Susan Cummings  Procedure(s) Performed: Procedure(s): LAPAROSCOPIC CHOLECYSTECTOMY (N/A)  Patient Location: PACU  Anesthesia Type:General  Level of Consciousness: sedated  Airway & Oxygen Therapy: Patient Spontanous Breathing and Patient connected to face mask oxygen  Post-op Assessment: Report given to RN and Post -op Vital signs reviewed and stable  Post vital signs: Reviewed and stable  Last Vitals:  Filed Vitals:   03/14/15 0847  BP: 121/85  Pulse: 76  Temp: 37 C  Resp: 20    Complications: No apparent anesthesia complications

## 2015-03-14 NOTE — Anesthesia Postprocedure Evaluation (Signed)
Anesthesia Post Note  Patient: Susan Cummings  Procedure(s) Performed: Procedure(s) (LRB): LAPAROSCOPIC CHOLECYSTECTOMY (N/A)  Patient location during evaluation: PACU Anesthesia Type: General Level of consciousness: awake and alert Pain management: pain level controlled Vital Signs Assessment: post-procedure vital signs reviewed and stable Respiratory status: spontaneous breathing, nonlabored ventilation, respiratory function stable and patient connected to nasal cannula oxygen Cardiovascular status: blood pressure returned to baseline and stable Postop Assessment: no signs of nausea or vomiting Anesthetic complications: no    Last Vitals:  Filed Vitals:   03/14/15 1241 03/14/15 1257  BP: 127/76 122/77  Pulse: 79 76  Temp: 36.9 C 35.8 C  Resp: 15 18    Last Pain:  Filed Vitals:   03/14/15 1300  PainSc: 2                  Cleda Mccreedy Piscitello

## 2015-03-14 NOTE — Op Note (Signed)
OPERATIVE REPORT  PREOPERATIVE DIAGNOSIS:  Chronic cholecystitis   POSTOPERATIVE DIAGNOSIS: Chronic cholecystitis   PROCEDURE: Laparoscopic cholecystectomy  ANESTHESIA: General  SURGEON: Renda Rolls M.D.  INDICATIONS: She has a chronic history of intermittent right upper quadrant abdominal pains. Ultrasound demonstrated no gallstones. Hepatobiliary scan has had low normal ejection fraction. Symptoms were highly suspicious of gallbladder disease and decision made for surgery.  With the patient on the operating table in the supine position under general endotracheal anesthesia the abdomen was prepared with ChloraPrep solution and draped in a sterile manner. A short incision was made in the inferior aspect of the umbilicus and carried down to the deep fascia which was grasped with a laryngeal hook. The Veress needle was inserted into the peritoneal cavity aspirated and irrigated with a saline solution. The peritoneal cavity was insufflated with carbon dioxide. The Veress needle was removed. The 10 mm cannula was inserted. The 10 mm 0 laparoscope was inserted to view the peritoneal cavity. Initial inspection revealed a smooth surface of the liver. There were some adhesions between the omentum and the lower abdominal wall. There was mild gaseous distention of the stomach. The anesthetist inserted a oral gastric tube to decompress the stomach. Another incision was made in the epigastrium slightly to the right of the midline to introduce an 11 mm cannula. 2 incisions were made in the lateral aspect of the right upper quadrant to introduce 2   5 mm cannulas.   The gallbladder was retracted towards the right shoulder.   The gallbladder neck was retracted inferiorly and laterally.  The porta hepatis was identified. There were some adhesions which were taken down with blunt and sharp dissection and use of electrocautery. The gallbladder was mobilized with incision of the visceral peritoneum. The cystic duct  was dissected free from surrounding structures. The cystic artery was dissected free from surrounding structures. A critical view of safety was demonstrated  An Endo Clip was placed across the cystic duct adjacent to the gallbladder neck. An incision was made in the cystic duct to introduce a Reddick catheter. The Reddick catheter would only thread in about 5 mm and therefore the cholangiogram was not done. The Reddick catheter was removed. The cystic duct was doubly ligated with endoclips and divided. The cystic artery was controlled with double endoclips and divided. The gallbladder was dissected free from the liver with use of hook and cautery and blunt dissection. Bleeding was minimal and hemostasis was intact. The gallbladder was delivered up through the infraumbilical incision opened and suctioned.  The gallbladder was removed. There were no palpable stones. The gallbladder was submitted in formalin for routine pathology. The cannulas were removed and carbon dioxide was allowed to escape from the peritoneal cavity. Several small subcutaneous bleeding points were cauterized. Subcutaneous tissues were infiltrated with half percent Sensorcaine with epinephrine. The skin incisions were closed with interrupted 5-0 chromic subcutaneous suture benzoin and Steri-Strips. Gauze dressings were applied with paper tape.  The patient appeared to be in satisfactory condition and was prepared for transfer to the recovery room  Renda Rolls M.D.

## 2015-03-14 NOTE — H&P (Signed)
  She reports no change in condition since office exam. Discussed plan for surgery

## 2015-03-14 NOTE — Discharge Instructions (Addendum)
Take Tylenol or Norco if needed for pain.  May resume aspirin on Saturday.  Remove dressings on Friday, may shower Saturday.Avoid straining and heavy lifting for 1 week.    AMBULATORY SURGERY  DISCHARGE INSTRUCTIONS   1) The drugs that you were given will stay in your system until tomorrow so for the next 24 hours you should not:  A) Drive an automobile B) Make any legal decisions C) Drink any alcoholic beverage   2) You may resume regular meals tomorrow.  Today it is better to start with liquids and gradually work up to solid foods.  You may eat anything you prefer, but it is better to start with liquids, then soup and crackers, and gradually work up to solid foods.   3) Please notify your doctor immediately if you have any unusual bleeding, trouble breathing, redness and pain at the surgery site, drainage, fever, or pain not relieved by medication.

## 2015-03-14 NOTE — Anesthesia Preprocedure Evaluation (Signed)
Anesthesia Evaluation  Patient identified by MRN, date of birth, ID band Patient awake    Reviewed: Allergy & Precautions, H&P , NPO status , Patient's Chart, lab work & pertinent test results  History of Anesthesia Complications Negative for: history of anesthetic complications  Airway Mallampati: II  TM Distance: >3 FB Neck ROM: full    Dental  (+) Poor Dentition, Implants, Chipped   Pulmonary neg pulmonary ROS, neg shortness of breath,    Pulmonary exam normal breath sounds clear to auscultation       Cardiovascular Exercise Tolerance: Good (-) angina(-) DOE Normal cardiovascular exam Rhythm:regular Rate:Normal     Neuro/Psych  Headaches, TIAnegative psych ROS   GI/Hepatic negative GI ROS, Neg liver ROS, neg GERD  ,  Endo/Other  negative endocrine ROS  Renal/GU Renal disease  negative genitourinary   Musculoskeletal   Abdominal   Peds  Hematology negative hematology ROS (+)   Anesthesia Other Findings Past Medical History:   Hemorrhoid                                      09/17/2014    Allergic rhinitis, seasonal                     09/17/2014    Gastrointestinal ulcer due to Helicobacter pyl* 09/17/2014    Calculus of kidney                              09/17/2014    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/17/2014    Cephalalgia                                     09/17/2014    Infection due to salmonella                     09/17/2014    Anemia                                                         Comment:H/O   Cerebral vascular accident (HCC)                               Comment:TIA-2011  Past Surgical History:   LITHOTRIPSY                                                   CESAREAN SECTION                                              BREAST BIOPSY  Left 08/16/12        Comment:negative   TUBAL LIGATION                                                NOVASURE  ABLATION                                            BMI    Body Mass Index   24.45 kg/m 2      Reproductive/Obstetrics negative OB ROS                             Anesthesia Physical Anesthesia Plan  ASA: III  Anesthesia Plan: General ETT   Post-op Pain Management:    Induction:   Airway Management Planned:   Additional Equipment:   Intra-op Plan:   Post-operative Plan:   Informed Consent: I have reviewed the patients History and Physical, chart, labs and discussed the procedure including the risks, benefits and alternatives for the proposed anesthesia with the patient or authorized representative who has indicated his/her understanding and acceptance.   Dental Advisory Given  Plan Discussed with: Anesthesiologist, CRNA and Surgeon  Anesthesia Plan Comments:         Anesthesia Quick Evaluation

## 2015-03-14 NOTE — OR Nursing (Signed)
Dr. Katrinka Blazing into see pt.  OK'd for dc home.

## 2015-03-14 NOTE — Anesthesia Procedure Notes (Signed)
Procedure Name: Intubation Date/Time: 03/14/2015 10:30 AM Performed by: Henrietta Hoover Pre-anesthesia Checklist: Patient identified, Emergency Drugs available, Suction available, Patient being monitored and Timeout performed Patient Re-evaluated:Patient Re-evaluated prior to inductionOxygen Delivery Method: Circle system utilized Preoxygenation: Pre-oxygenation with 100% oxygen Intubation Type: IV induction Ventilation: Mask ventilation without difficulty Laryngoscope Size: Mac and 3 Grade View: Grade I Tube type: Oral Tube size: 7.0 mm Number of attempts: 1 Airway Equipment and Method: Stylet Secured at: 21 cm Tube secured with: Tape Dental Injury: Teeth and Oropharynx as per pre-operative assessment  Future Recommendations: Recommend- induction with short-acting agent, and alternative techniques readily available

## 2015-03-15 LAB — SURGICAL PATHOLOGY

## 2015-09-10 ENCOUNTER — Other Ambulatory Visit: Payer: Self-pay | Admitting: Internal Medicine

## 2015-09-10 ENCOUNTER — Ambulatory Visit
Admission: RE | Admit: 2015-09-10 | Discharge: 2015-09-10 | Disposition: A | Discharge status: Home / Self Care | Payer: Managed Care, Other (non HMO) | Source: Ambulatory Visit | Attending: Internal Medicine | Admitting: Internal Medicine

## 2015-09-10 DIAGNOSIS — R600 Localized edema: Secondary | ICD-10-CM

## 2016-03-09 ENCOUNTER — Other Ambulatory Visit: Payer: Self-pay | Admitting: Obstetrics and Gynecology

## 2016-03-09 DIAGNOSIS — Z1231 Encounter for screening mammogram for malignant neoplasm of breast: Secondary | ICD-10-CM

## 2016-03-10 ENCOUNTER — Ambulatory Visit
Admission: RE | Admit: 2016-03-10 | Discharge: 2016-03-10 | Disposition: A | Discharge status: Home / Self Care | Payer: Managed Care, Other (non HMO) | Source: Ambulatory Visit | Attending: Obstetrics and Gynecology | Admitting: Obstetrics and Gynecology

## 2016-03-10 DIAGNOSIS — Z1231 Encounter for screening mammogram for malignant neoplasm of breast: Secondary | ICD-10-CM | POA: Insufficient documentation

## 2016-03-18 ENCOUNTER — Other Ambulatory Visit: Payer: Self-pay | Admitting: Obstetrics and Gynecology

## 2016-03-18 DIAGNOSIS — N6489 Other specified disorders of breast: Secondary | ICD-10-CM

## 2016-03-18 DIAGNOSIS — R921 Mammographic calcification found on diagnostic imaging of breast: Secondary | ICD-10-CM

## 2016-03-18 DIAGNOSIS — R928 Other abnormal and inconclusive findings on diagnostic imaging of breast: Secondary | ICD-10-CM

## 2016-03-19 ENCOUNTER — Ambulatory Visit
Admission: RE | Admit: 2016-03-19 | Discharge: 2016-03-19 | Disposition: A | Discharge status: Home / Self Care | Payer: Managed Care, Other (non HMO) | Source: Ambulatory Visit | Attending: Obstetrics and Gynecology | Admitting: Obstetrics and Gynecology

## 2016-03-19 DIAGNOSIS — N6489 Other specified disorders of breast: Secondary | ICD-10-CM

## 2016-03-19 DIAGNOSIS — R928 Other abnormal and inconclusive findings on diagnostic imaging of breast: Secondary | ICD-10-CM

## 2016-03-19 DIAGNOSIS — R921 Mammographic calcification found on diagnostic imaging of breast: Secondary | ICD-10-CM

## 2016-03-19 DIAGNOSIS — N6002 Solitary cyst of left breast: Secondary | ICD-10-CM | POA: Insufficient documentation

## 2016-03-20 ENCOUNTER — Other Ambulatory Visit: Payer: BLUE CROSS/BLUE SHIELD

## 2016-03-20 ENCOUNTER — Ambulatory Visit: Payer: BLUE CROSS/BLUE SHIELD

## 2016-03-27 ENCOUNTER — Other Ambulatory Visit: Payer: Self-pay | Admitting: Obstetrics and Gynecology

## 2016-03-27 DIAGNOSIS — R921 Mammographic calcification found on diagnostic imaging of breast: Secondary | ICD-10-CM

## 2016-09-17 ENCOUNTER — Ambulatory Visit
Admission: RE | Admit: 2016-09-17 | Discharge: 2016-09-17 | Disposition: A | Discharge status: Home / Self Care | Payer: Managed Care, Other (non HMO) | Source: Ambulatory Visit | Attending: Obstetrics and Gynecology | Admitting: Obstetrics and Gynecology

## 2016-09-17 DIAGNOSIS — R921 Mammographic calcification found on diagnostic imaging of breast: Secondary | ICD-10-CM | POA: Diagnosis present

## 2016-12-23 ENCOUNTER — Ambulatory Visit: Payer: BLUE CROSS/BLUE SHIELD | Admitting: Urology

## 2017-02-18 ENCOUNTER — Other Ambulatory Visit: Payer: Self-pay | Admitting: Obstetrics and Gynecology

## 2017-02-18 DIAGNOSIS — N6310 Unspecified lump in the right breast, unspecified quadrant: Secondary | ICD-10-CM

## 2017-03-22 ENCOUNTER — Ambulatory Visit
Admission: RE | Admit: 2017-03-22 | Discharge: 2017-03-22 | Disposition: A | Discharge status: Home / Self Care | Payer: 59 | Source: Ambulatory Visit | Attending: Obstetrics and Gynecology | Admitting: Obstetrics and Gynecology

## 2017-03-22 DIAGNOSIS — N6310 Unspecified lump in the right breast, unspecified quadrant: Secondary | ICD-10-CM

## 2017-03-22 DIAGNOSIS — R928 Other abnormal and inconclusive findings on diagnostic imaging of breast: Secondary | ICD-10-CM | POA: Diagnosis present

## 2017-03-22 DIAGNOSIS — R921 Mammographic calcification found on diagnostic imaging of breast: Secondary | ICD-10-CM | POA: Diagnosis not present

## 2017-06-01 ENCOUNTER — Encounter: Payer: Self-pay | Admitting: Emergency Medicine

## 2017-06-01 ENCOUNTER — Emergency Department: Payer: 59

## 2017-06-01 ENCOUNTER — Emergency Department
Admission: EM | Admit: 2017-06-01 | Discharge: 2017-06-01 | Disposition: A | Discharge status: Home / Self Care | Payer: 59 | Attending: Emergency Medicine | Admitting: Emergency Medicine

## 2017-06-01 DIAGNOSIS — Z8673 Personal history of transient ischemic attack (TIA), and cerebral infarction without residual deficits: Secondary | ICD-10-CM | POA: Insufficient documentation

## 2017-06-01 DIAGNOSIS — R109 Unspecified abdominal pain: Secondary | ICD-10-CM | POA: Diagnosis present

## 2017-06-01 DIAGNOSIS — Z3202 Encounter for pregnancy test, result negative: Secondary | ICD-10-CM | POA: Diagnosis not present

## 2017-06-01 DIAGNOSIS — N200 Calculus of kidney: Secondary | ICD-10-CM | POA: Diagnosis not present

## 2017-06-01 LAB — LIPASE, BLOOD: Lipase: 36 U/L (ref 11–51)

## 2017-06-01 LAB — COMPREHENSIVE METABOLIC PANEL
ALBUMIN: 4.4 g/dL (ref 3.5–5.0)
ALK PHOS: 45 U/L (ref 38–126)
ALT: 17 U/L (ref 14–54)
AST: 19 U/L (ref 15–41)
Anion gap: 4 — ABNORMAL LOW (ref 5–15)
BUN: 10 mg/dL (ref 6–20)
CALCIUM: 9.6 mg/dL (ref 8.9–10.3)
CO2: 29 mmol/L (ref 22–32)
CREATININE: 0.73 mg/dL (ref 0.44–1.00)
Chloride: 105 mmol/L (ref 101–111)
GFR calc Af Amer: >60 mL/min (ref >60)
GFR calc non Af Amer: >60 mL/min (ref >60)
GLUCOSE: 91 mg/dL (ref 65–99)
Potassium: 4.2 mmol/L (ref 3.5–5.1)
SODIUM: 138 mmol/L (ref 135–145)
Total Bilirubin: 1.1 mg/dL (ref 0.3–1.2)
Total Protein: 6.7 g/dL (ref 6.5–8.1)

## 2017-06-01 LAB — URINALYSIS, COMPLETE (UACMP) WITH MICROSCOPIC
Bilirubin Urine: NEGATIVE
GLUCOSE, UA: NEGATIVE mg/dL
Hgb urine dipstick: NEGATIVE
Ketones, ur: NEGATIVE mg/dL
Leukocytes, UA: NEGATIVE
Nitrite: NEGATIVE
PROTEIN: NEGATIVE mg/dL
SPECIFIC GRAVITY, URINE: 1.008 (ref 1.005–1.030)
WBC UA: NONE SEEN WBC/hpf (ref 0–5)
pH: 6 (ref 5.0–8.0)

## 2017-06-01 LAB — CBC
HEMATOCRIT: 42.4 % (ref 35.0–47.0)
HEMOGLOBIN: 14.5 g/dL (ref 12.0–16.0)
MCH: 30.6 pg (ref 26.0–34.0)
MCHC: 34.3 g/dL (ref 32.0–36.0)
MCV: 89.3 fL (ref 80.0–100.0)
Platelets: 270 10*3/uL (ref 150–440)
RBC: 4.74 MIL/uL (ref 3.80–5.20)
RDW: 13.4 % (ref 11.5–14.5)
WBC: 10.7 10*3/uL (ref 3.6–11.0)

## 2017-06-01 LAB — POCT PREGNANCY, URINE: PREG TEST UR: NEGATIVE

## 2017-06-01 MED ORDER — TAMSULOSIN HCL 0.4 MG PO CAPS: 0.4000 mg | ORAL_CAPSULE | Freq: Every day | ORAL | 0 refills | Status: DC

## 2017-06-01 MED ORDER — OXYCODONE-ACETAMINOPHEN 5-325 MG PO TABS: 1.0000 | ORAL_TABLET | ORAL | 0 refills | Status: DC | PRN

## 2017-06-01 MED ORDER — KETOROLAC TROMETHAMINE 30 MG/ML IJ SOLN
30.0000 mg | Freq: Once | INTRAMUSCULAR | Status: AC
  Administered 2017-06-01: 30 mg via INTRAVENOUS
  Filled 2017-06-01: qty 1

## 2017-06-01 NOTE — ED Provider Notes (Signed)
[facility] Emergency Department Provider Note  Time seen: 10:20 AM  I have reviewed the triage vital signs and the nursing notes.   HISTORY  Chief Complaint Flank Pain    HPI Susan Cummings SeenMichelle C Bou is a 47 y.o. female with a past medical history of anemia, CVA, kidney stones, presents to the emergency department for right flank pain.  According to the patient over the past 2 days she has been experiencing intermittent right flank discomfort, along with dark urine.  Denies any dysuria, but has noticed some discoloration of the urine at times but did not appear red.  Patient states this morning the pain was more severe, she became diaphoretic and nauseated due to the pain.  Took Zofran at home and states the nausea is gone.  Denies any diarrhea.  Denies any fever.  Denies dysuria.  Patient states a history of kidney stones in the past which this feels somewhat similar.   Past Medical History:  Diagnosis Date  . Allergic rhinitis, seasonal 09/17/2014  . Anemia    H/O  . 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 Calculus of kidney 09/17/2014  . Cephalalgia 09/17/2014  . Cerebral vascular accident (HCC)    TIA-2011  . Gastrointestinal ulcer due to Helicobacter pylori 09/17/2014  . Hemorrhoid 09/17/2014  . Infection due to salmonella 09/17/2014    Patient Active Problem List   Diagnosis Date Noted  . 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 Gastrointestinal ulcer due to Helicobacter pylori 09/17/2014  . Cephalalgia 09/17/2014  . Hemorrhoid 09/17/2014  . Calculus of kidney 09/17/2014  . Infection due to salmonella 09/17/2014  . Allergic rhinitis, seasonal 09/17/2014  . Nephrolithiasis 09/17/2014  . Flank pain 09/17/2014  . Gross hematuria 09/17/2014  . Cerebral vascular accident [facility](HCC) 08/03/2013    Past Surgical History:  Procedure Laterality Date  . BREAST BIOPSY Left 08/16/12   negative  . CESAREAN SECTION    . CHOLECYSTECTOMY N/A 03/14/2015   Procedure: LAPAROSCOPIC  CHOLECYSTECTOMY;  Surgeon: Nadeen LandauJarvis Wilton Smith, MD;  Location: [facility] ORS;  Service: General;  Laterality: N/A;  . LITHOTRIPSY    . NOVASURE ABLATION    . TUBAL LIGATION      Prior to Admission medications   Medication Sig Start Date End Date Taking? Authorizing Provider  acetaminophen (TYLENOL) 325 MG tablet Take by mouth.    [provider]  ALPRAZolam Prudy Feeler(XANAX) 0.5 MG tablet Take 0.5 mg by mouth as needed.  08/07/14   [provider]  dicyclomine (BENTYL) 10 MG capsule Take 10 mg by mouth as needed.  08/31/14   [provider]  HYDROcodone-acetaminophen (NORCO) 5-325 MG tablet Take 1-2 tablets by mouth every 4 (four) hours as needed for moderate pain. 03/14/15   Nadeen LandauSmith, Jarvis Wilton, MD  Multiple Vitamin (MULTI-VITAMINS) TABS Take by mouth.    [provider]    Allergies  Allergen Reactions  . Simvastatin Other (See Comments)    bruising  . Cefdinir Rash  . Penicillins Rash    Family History  Problem Relation Age of Onset  . Hypertension Mother   . Diabetes Maternal Grandmother     Social History Social History   Tobacco Use  . Smoking status: Never Smoker  . Smokeless tobacco: Never Used  Substance Use Topics  . Alcohol use: Yes    Alcohol/week: 0.0 oz    Comment: OCC  . Drug use: No    Review of Systems Constitutional: Negative for fever. Eyes: Negative for visual complaints ENT: Negative for recent illness/congestion  Cardiovascular: Negative for chest pain. Respiratory: Negative for shortness of breath. Gastrointestinal: Right flank pain times 2 days.  Positive for nausea.  Negative for vomiting or diarrhea. Genitourinary: Negative for dysuria, occasional dark urine. Musculoskeletal: Negative for musculoskeletal complaints Skin: Negative for skin complaints  Neurological: Negative for headache All other ROS negative  ____________________________________________   PHYSICAL EXAM:  VITAL SIGNS: ED Triage Vitals  Enc Vitals  Group     BP 06/01/17 0926 137/84     Pulse Rate 06/01/17 0926 78     Resp 06/01/17 0926 18     Temp 06/01/17 0926 99.1 F (37.3 C)     Temp Source 06/01/17 0926 Oral     SpO2 06/01/17 0926 100 %     Weight 06/01/17 0927 150 lb (68 kg)     Height 06/01/17 0927 5\' 2"  (1.575 m)     Head Circumference --      Peak Flow --      Pain Score 06/01/17 0927 5     Pain Loc --      Pain Edu? --      Excl. in GC? --    Constitutional: Alert and oriented. Well appearing and in no distress. Eyes: Normal exam ENT   Head: Normocephalic and atraumatic.   Mouth/Throat: Mucous membranes are moist. Cardiovascular: Normal rate, regular rhythm. No murmur Respiratory: Normal respiratory effort without tachypnea nor retractions. Breath sounds are clear  Gastrointestinal: Soft, slight right lower quadrant tenderness to palpation.  No rebound or guarding.  No distention.  No CVA tenderness. Musculoskeletal: Nontender with normal range of motion in all extremities. No lower extremity tenderness or edema. Neurologic:  Normal speech and language. No gross focal neurologic deficits are appreciated. Skin:  Skin is warm, dry and intact.  Psychiatric: Mood and affect are normal. Speech and behavior are normal.   ____________________________________________   RADIOLOGY  CT read is largely negative.  ____________________________________________   INITIAL IMPRESSION / ASSESSMENT AND PLAN / ED COURSE  Pertinent labs & imaging results that were available during my care of the patient were reviewed by me and considered in my medical decision making (see chart for details).  Presents to the emergency department for right flank pain intermittent over the past 2 days worse over the past 6 hours.  Describes as mild 3/10 currently.  Differential would include ureterolithiasis, appendicitis, colitis, enteritis.  We will check labs, CT renal scan to further evaluate and continue to closely monitor.  Patient  states her pain is mild and does not wish for pain medication at this time.  CT read is largely negative.  I reviewed the CAT scan there appears to be a very punctate stone halfway down the right ureter on my evaluation.  Patient states near complete resolution of pain after Toradol.  We will discharge with a short course of Percocet and Flomax, encouraged the patient drink plenty of fluids and follow-up with her doctor.  I also discussed return precautions.  Patient agreeable to this plan of care.  ____________________________________________   FINAL CLINICAL IMPRESSION(S) / ED DIAGNOSES  Right flank pain Kidney stone    Minna Antis, MD 06/01/17 1239

## 2017-06-01 NOTE — ED Notes (Signed)
Pt to ct 

## 2017-06-01 NOTE — ED Triage Notes (Signed)
Patient presents to the ED with right sided flank pain x 2 days but much worse this morning.  Patient reports history of kidney stones and states this feels similar.  Patient states pain is worse with standing.  Patient denies dysuria.

## 2017-06-01 NOTE — ED Notes (Signed)
Pt states she started having pain in her lower abdomen on Saturday, on Sunday had discolored urine, and yesterday states she was fine. She awakened this morning to increased pain in her right flank, followed by diaphoresis and nausea. Pt alert & oriented with NAD noted.

## 2017-06-01 NOTE — ED Notes (Signed)
Pt discharged home after verbalizing understanding of discharge instructions; nad noted. 

## 2017-11-03 ENCOUNTER — Ambulatory Visit
Admission: RE | Admit: 2017-11-03 | Discharge: 2017-11-03 | Disposition: A | Discharge status: Home / Self Care | Payer: 59 | Source: Ambulatory Visit | Attending: Internal Medicine | Admitting: Internal Medicine

## 2017-11-03 ENCOUNTER — Other Ambulatory Visit: Payer: Self-pay | Admitting: Internal Medicine

## 2017-11-03 DIAGNOSIS — N200 Calculus of kidney: Secondary | ICD-10-CM | POA: Diagnosis not present

## 2017-11-03 DIAGNOSIS — R100 Acute abdomen: Secondary | ICD-10-CM | POA: Diagnosis present

## 2017-11-16 ENCOUNTER — Ambulatory Visit: Payer: 59 | Admitting: Urology

## 2017-11-16 ENCOUNTER — Encounter: Payer: Self-pay | Admitting: Urology

## 2017-11-16 ENCOUNTER — Ambulatory Visit
Admission: RE | Admit: 2017-11-16 | Discharge: 2017-11-16 | Disposition: A | Discharge status: Home / Self Care | Payer: 59 | Source: Ambulatory Visit | Attending: Internal Medicine | Admitting: Internal Medicine

## 2017-11-16 ENCOUNTER — Ambulatory Visit
Admission: RE | Admit: 2017-11-16 | Discharge: 2017-11-16 | Disposition: A | Discharge status: Home / Self Care | Payer: 59 | Source: Ambulatory Visit | Attending: Urology | Admitting: Urology

## 2017-11-16 VITALS — BP 124/84 | HR 87 | Ht 62.0 in | Wt 149.0 lb

## 2017-11-16 DIAGNOSIS — N200 Calculus of kidney: Secondary | ICD-10-CM

## 2017-11-16 DIAGNOSIS — N393 Stress incontinence (female) (male): Secondary | ICD-10-CM

## 2017-11-16 LAB — URINALYSIS, COMPLETE
BILIRUBIN UA: NEGATIVE
GLUCOSE, UA: NEGATIVE
KETONES UA: NEGATIVE
Leukocytes, UA: NEGATIVE
Nitrite, UA: NEGATIVE
PH UA: 6.5 (ref 5.0–7.5)
PROTEIN UA: NEGATIVE
RBC UA: NEGATIVE
Urobilinogen, Ur: 0.2 mg/dL (ref 0.2–1.0)

## 2017-11-16 NOTE — Progress Notes (Addendum)
11/16/2017 2:46 PM   Susan Cummings 01/18/1971 [id number]  Referring provider: Barbette ReichmannHande, Vishwanath, MD [address] [facility] [place], [place] [id number]  CC: Nephrolithiasis/stress incontinence  HPI: I had the pleasure of seeing Susan Cummings in urology clinic today for history of nephrolithiasis as well as stress incontinence.  She is a healthy 47 year old female who is past 10 to 15 stone spontaneously, as well as had one shockwave lithotripsy previously.  She denies a family history of kidney stones.  She most recently passed a stone in September 2019.  This passed approximately 15 minutes prior to undergoing a CT scan.  The CT scan did demonstrate a residual approximately 3 mm stone in the left midpole.  She currently denies symptoms including flank pain, gross hematuria, urgency/frequency.  She feels that the main reason she is forming so many stones is that she drinks very little water secondary to her severe stress incontinence.  She has had 2 prior vaginal deliveries, as well as a C-section.  She reports she leaks a significant amount of urine with coughing, sneezing, jumping, and exercise.  She also reports mild occasional urge and urge incontinence.  Wears multiple pads per day.  She denies recurrent UTIs.   PMH: Past Medical History:  Diagnosis Date  . Allergic rhinitis, seasonal 09/17/2014  . Anemia    H/O  . 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 Calculus of kidney 09/17/2014  . Cephalalgia 09/17/2014  . Cerebral vascular accident (HCC)    TIA-2011  . Gastrointestinal ulcer due to Helicobacter pylori 09/17/2014  . Hemorrhoid 09/17/2014  . Infection due to salmonella 09/17/2014    Surgical History: Past Surgical History:  Procedure Laterality Date  . BREAST BIOPSY Left 08/16/12   negative  . CESAREAN SECTION    . CHOLECYSTECTOMY N/A 03/14/2015   Procedure: LAPAROSCOPIC CHOLECYSTECTOMY;  Surgeon: Nadeen LandauJarvis Wilton Smith, MD;  Location: [facility] ORS;  Service: General;   Laterality: N/A;  . LITHOTRIPSY    . NOVASURE ABLATION    . TUBAL LIGATION      Allergies:  Allergies  Allergen Reactions  . Simvastatin Other (See Comments)    bruising  . Cefdinir Rash  . Penicillins Rash    Has patient had a PCN reaction causing immediate rash, facial/tongue/throat swelling, SOB or lightheadedness with hypotension: No Has patient had a PCN reaction causing severe rash involving mucus membranes or skin necrosis: No Has patient had a PCN reaction that required hospitalization: No Has patient had a PCN reaction occurring within the last 10 years: No If all of the above answers are "NO", then may proceed with Cephalosporin use.     Family History: Family History  Problem Relation Age of Onset  . Hypertension Mother   . Diabetes Maternal Grandmother     Social History:  reports that she has never smoked. She has never used smokeless tobacco. She reports that she drinks alcohol. She reports that she does not use drugs.  ROS: Please see flowsheet from today's date for complete review of systems.  Physical Exam: BP 124/84   Pulse 87   Ht 5\' 2"  (1.575 m)   Wt 149 lb (67.6 kg)   BMI 27.25 kg/m    Constitutional:  Alert and oriented, No acute distress. Cardiovascular: No clubbing, cyanosis, or edema. Respiratory: Normal respiratory effort, no increased work of breathing. GI: Abdomen is soft, nontender, nondistended, no abdominal masses GU: No CVA tenderness Lymph: No cervical or inguinal lymphadenopathy. Skin: No rashes, bruises or suspicious lesions. Neurologic:  Grossly intact, no focal deficits, moving all 4 extremities. Psychiatric: Normal mood and affect.  Laboratory Data:  Urinalysis today 0 WBCs, 0 RBCs, no bacteria, nitrite negative  Pertinent Imaging: I have personally reviewed the CT renal stone dated 11/03/2017.  There is a 3 mm left nonobstructing midpole stone, and a right punctate midpole stone.  Assessment & Plan:   In summary, Ms.  Cummings is a 47 year old female with recurrent stone disease, with current burden of a 3 mm left nonobstructing midpole stone, as well as severe stress urinary incontinence.  I agree that her recurrent stone disease is likely from of limiting fluid secondary to her stress incontinence.  We discussed various treatment options for urolithiasis including observation with or without medical expulsive therapy, shockwave lithotripsy (SWL), ureteroscopy and laser lithotripsy with stent placement, and percutaneous nephrolithotomy.  We discussed that management is based on stone size, location, density, patient co-morbidities, and patient preference.   Stones <30mm in size have a >80% spontaneous passage rate. Data surrounding the use of tamsulosin for medical expulsive therapy is controversial, but meta analyses suggests it is most efficacious for distal stones between 5-47mm in size. Possible side effects include dizziness/lightheadedness, and retrograde ejaculation.  SWL has a lower stone free rate in a single procedure, but also a lower complication rate compared to ureteroscopy and avoids a stent and associated stent related symptoms. Possible complications include renal hematoma, steinstrasse, and need for additional treatment.  Ureteroscopy with laser lithotripsy and stent placement has a higher stone free rate than SWL in a single procedure, however increased complication rate including possible infection, ureteral injury, bleeding, and stent related morbidity. Common stent related symptoms include dysuria, urgency/frequency, and flank pain.  She would like to proceed with KUB today to see if stone is amenable to shockwave lithotripsy.  If so, she would like to see if this will be covered by insurance prior to scheduling SWL.  Follow up stone analysis from stone brought in today, we discussed prevention strategies in detail  I will also have her follow-up with Susan Cummings regarding stress  incontinence, and potentially discuss sling  ADDENDUM: LEFT midpole renal stone 4mm, clearly seen on KUB, 410 HU on recent CT, SSD 7.5cm, favorable SWL candidate. She will check with her insurance today regarding coverage, and let us know how she would like to proceed. We also discussed observation is an option, as well as URS/LL/stent.   Sondra Come, MD  [facility] [address], [address] [place], [place] [address] [phone]

## 2017-11-17 ENCOUNTER — Telehealth: Payer: Self-pay | Admitting: Urology

## 2017-11-17 NOTE — Telephone Encounter (Signed)
Patient will call back later to let us know about [facility] and then she will need to be scheduled with him within the next week or two.   Susan DusterMichelle

## 2017-11-22 ENCOUNTER — Other Ambulatory Visit: Payer: Self-pay | Admitting: Urology

## 2017-12-06 NOTE — Telephone Encounter (Signed)
Patient reports stone has not passed & there is still occasional pain mainly on the right side. Patient would like to continue surveillance to see if stone will pass due to cost of procedure. Encouraged patient to discuss with Dr Sherron Monday at upcoming appointment. Questions answered. Patient voices understanding.

## 2017-12-20 ENCOUNTER — Ambulatory Visit: Payer: 59 | Admitting: Urology

## 2017-12-22 NOTE — Telephone Encounter (Signed)
Patient denies pain at present & would like to postpone ESWL & possible sling until after new insurance takes effect in December 2019. Appointment made with Dr Sherron Monday to discuss possible sling & ESWL on 02/14/2018 at patient's request.

## 2018-02-14 ENCOUNTER — Ambulatory Visit: Payer: 59 | Admitting: Urology

## 2018-02-28 ENCOUNTER — Ambulatory Visit: Payer: 59 | Admitting: Urology

## 2018-06-13 ENCOUNTER — Other Ambulatory Visit: Payer: Self-pay | Admitting: Obstetrics and Gynecology

## 2018-06-13 DIAGNOSIS — R921 Mammographic calcification found on diagnostic imaging of breast: Secondary | ICD-10-CM

## 2018-06-13 DIAGNOSIS — Z1231 Encounter for screening mammogram for malignant neoplasm of breast: Secondary | ICD-10-CM

## 2018-06-20 ENCOUNTER — Other Ambulatory Visit: Payer: 59

## 2018-06-27 ENCOUNTER — Ambulatory Visit
Admission: RE | Admit: 2018-06-27 | Discharge: 2018-06-27 | Disposition: A | Discharge status: Home / Self Care | Payer: Managed Care, Other (non HMO) | Source: Ambulatory Visit | Attending: Obstetrics and Gynecology | Admitting: Obstetrics and Gynecology

## 2018-06-27 ENCOUNTER — Other Ambulatory Visit: Payer: Self-pay

## 2018-06-27 DIAGNOSIS — R921 Mammographic calcification found on diagnostic imaging of breast: Secondary | ICD-10-CM

## 2018-06-27 DIAGNOSIS — Z1231 Encounter for screening mammogram for malignant neoplasm of breast: Secondary | ICD-10-CM | POA: Diagnosis present

## 2019-01-28 IMAGING — CT CT RENAL STONE PROTOCOL
2 of 4 series · 16 of 46 positions shown, 18 images · non-contrast
Comparison: 06/01/2017

CLINICAL DATA: Left flank pain.

EXAM:
CT ABDOMEN AND PELVIS WITHOUT CONTRAST
TECHNIQUE: Multidetector CT imaging of the abdomen and pelvis was performed
following the standard protocol without IV contrast.

[Series 2: renal stone · axial · 0.65mm/px · z∈[-1499,-1119]mm · 13 of 84 slices shown, 15 images (1 of 2)]
[im 4/84  soft-tissue]
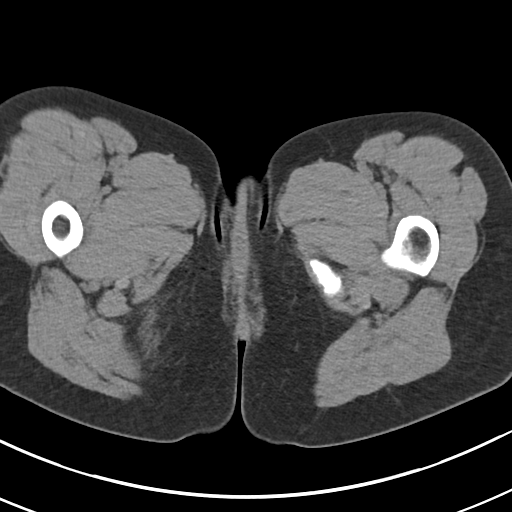
[im 4/84  bone]
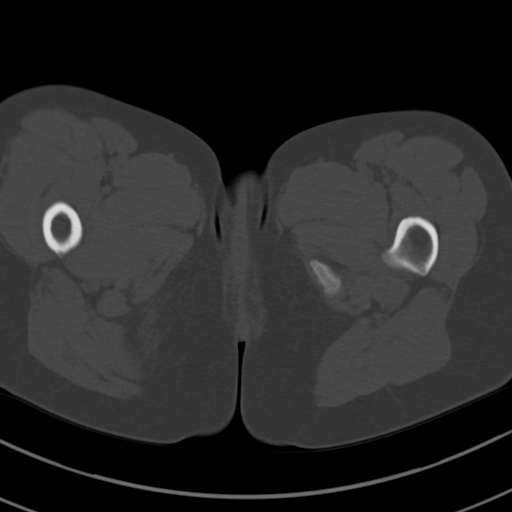
[im 11/84  soft-tissue]
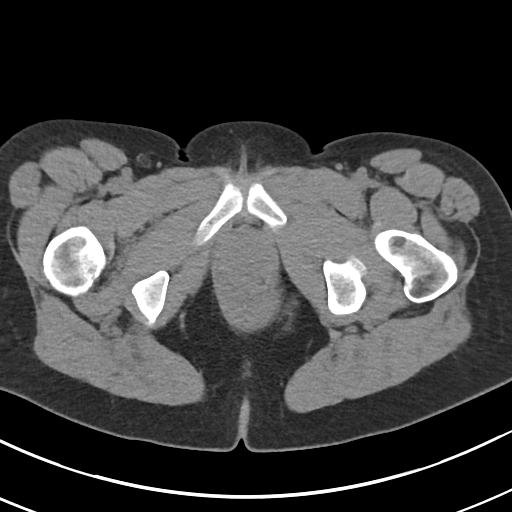
[im 19/84  soft-tissue]
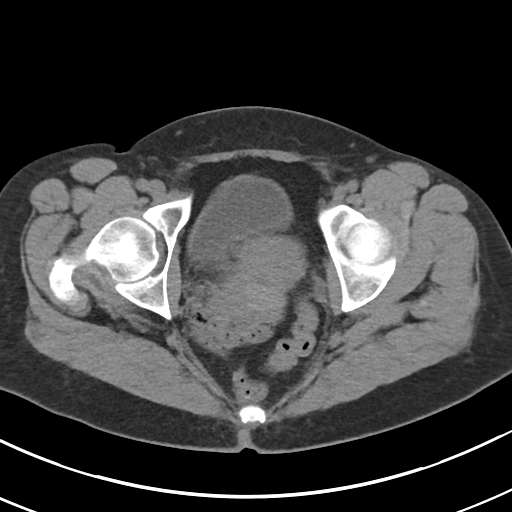
[im 22/84  soft-tissue]
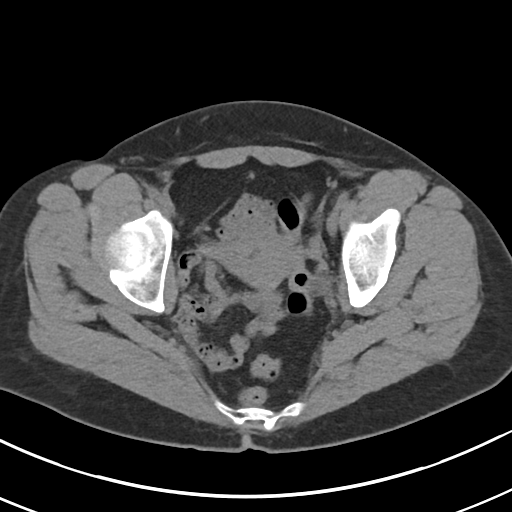
[im 29/84  soft-tissue]
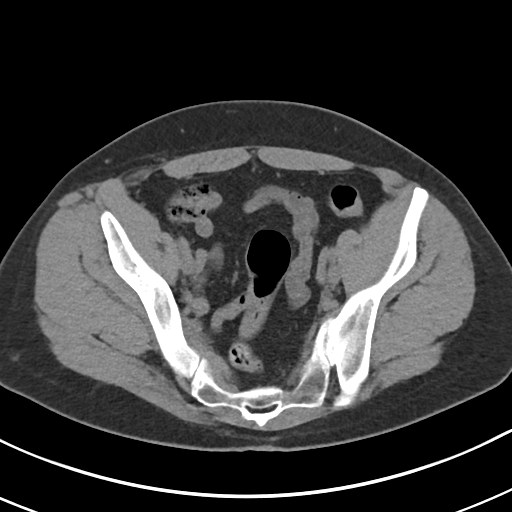
[im 37/84  soft-tissue]
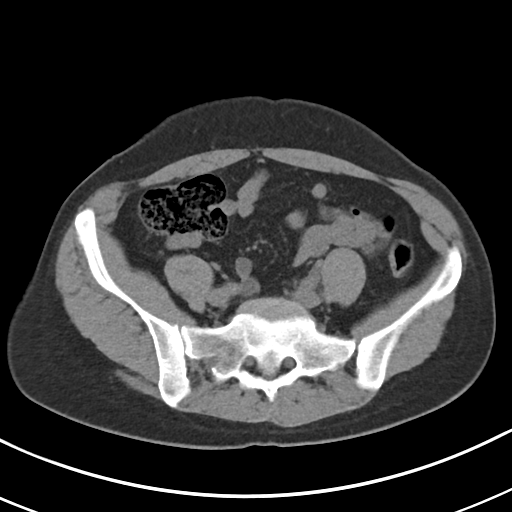
[im 44/84  soft-tissue]
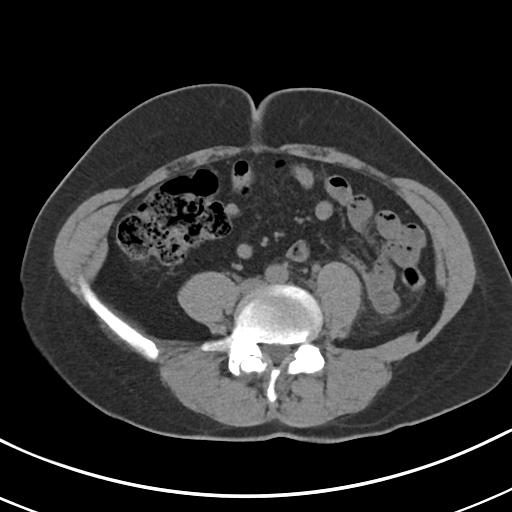
[im 47/84  soft-tissue]
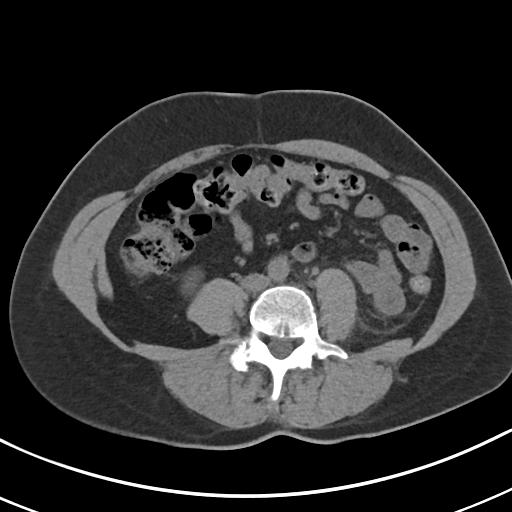
[im 55/84  soft-tissue]
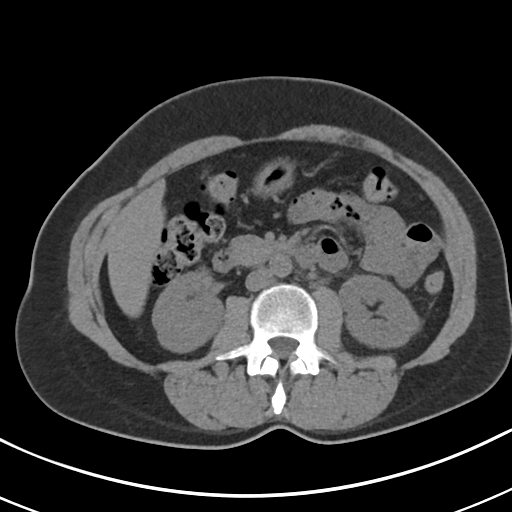
[im 55/84  bone]
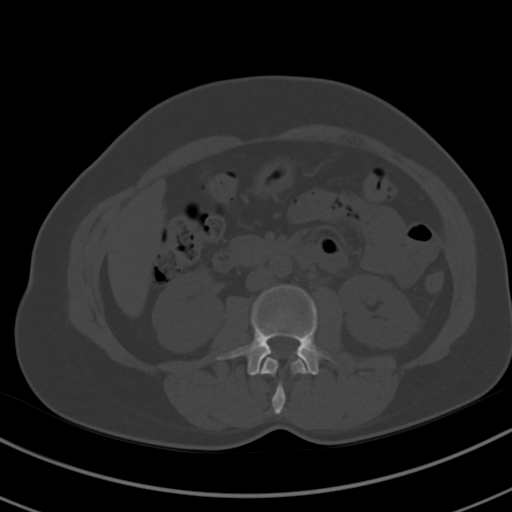
[im 62/84  soft-tissue]
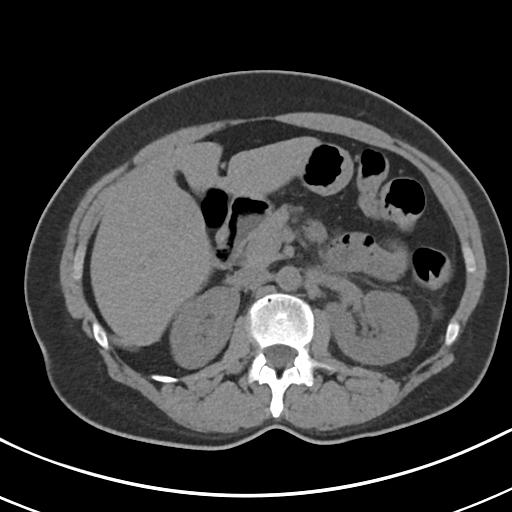
[im 65/84  soft-tissue]
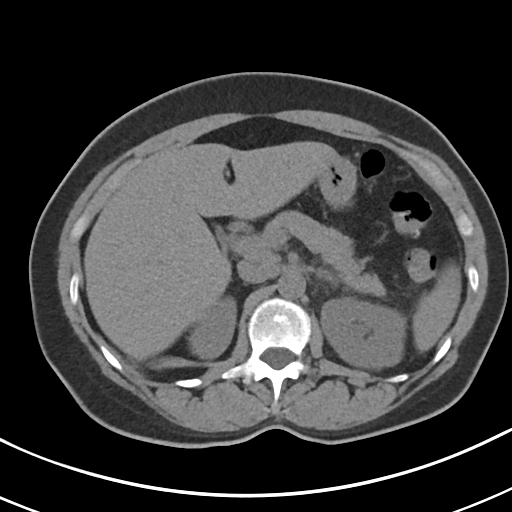
[im 73/84  soft-tissue]
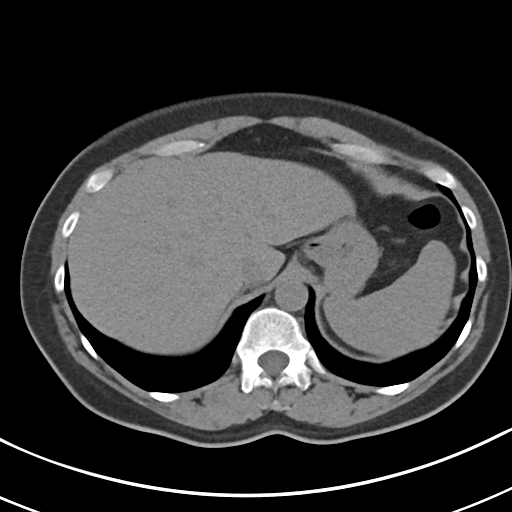
[im 80/84  soft-tissue]
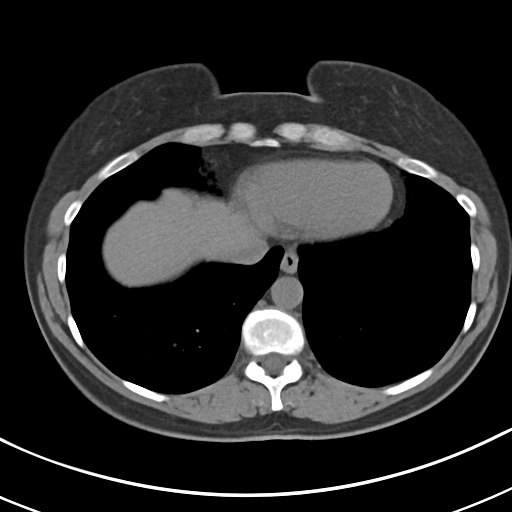

[Series 4: renal stone · coronal · 0.65mm/px · 3 of 127 slices shown (2 of 2)]
[im 43/127  soft-tissue]
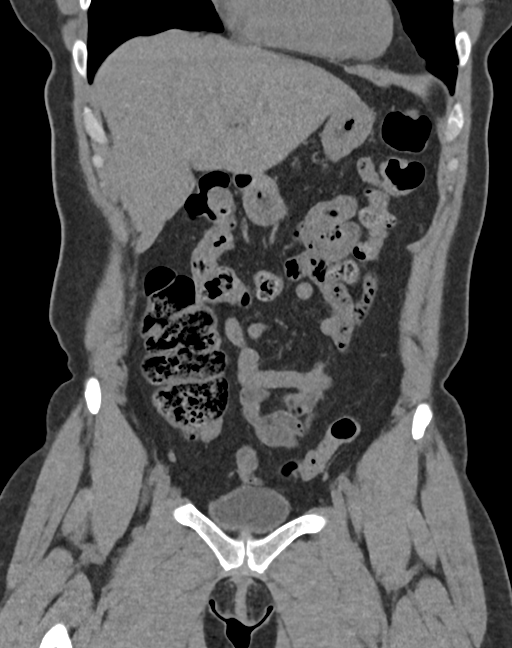
[im 57/127  soft-tissue]
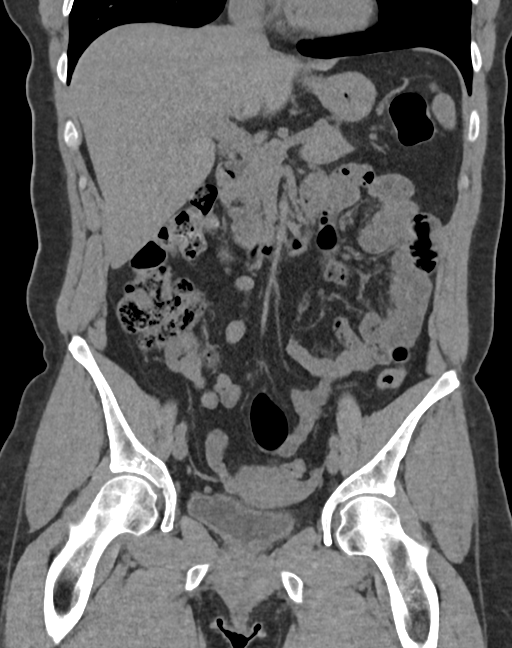
[im 71/127  soft-tissue]
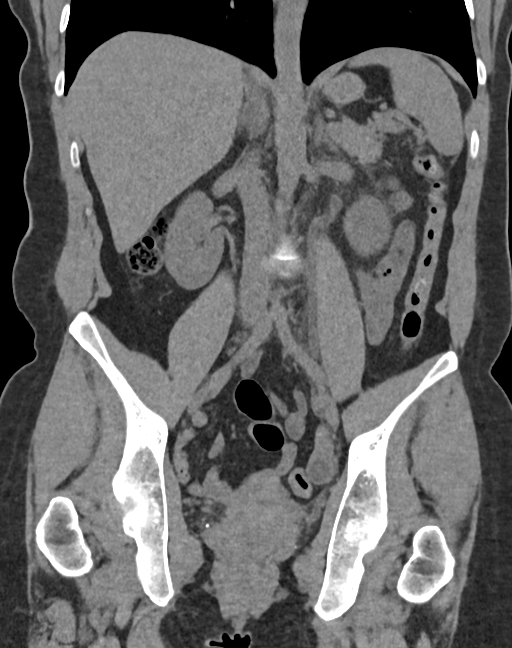

[16 of 46 positions shown; findings below may reference images not displayed]

FINDINGS: Lower chest: No acute abnormality.

Hepatobiliary: Previous cholecystectomy. No biliary dilatation. No
focal liver abnormality identified.

Pancreas: Unremarkable. No pancreatic ductal dilatation or
surrounding inflammatory changes.

Spleen: Normal in size without focal abnormality.

Adrenals/Urinary Tract: Normal appearance of the adrenal glands.

2 mm stone noted within the inferior pole of the right kidney.
Interpolar left kidney stone measures 3 mm. Mild soft tissue
stranding adjacent to the inferior pole of the left kidney noted.
There is mild left-sided periureteral soft tissue stranding. No
ureteral calculi identified. Urinary bladder appears normal.

Stomach/Bowel: Stomach is normal. The small bowel loops have a
normal course and caliber without obstruction. The appendix is
visualized and appears normal. Unremarkable appearance of the colon.

Vascular/Lymphatic: Normal appearance of the abdominal aorta. No
enlarged retroperitoneal or mesenteric adenopathy. No enlarged
pelvic or inguinal lymph nodes.

Reproductive: Uterus and bilateral adnexa are unremarkable.

Other: No free fluid or fluid collections.

Musculoskeletal: No acute or significant osseous findings.
IMPRESSION: 1. There is mild left periureteral soft tissue stranding and
perinephric fat stranding without hydronephrosis or ureteral
calculi. Findings are compatible with clinical history of recently
passed stone.
2. Bilateral kidney stones.

## 2019-05-09 ENCOUNTER — Other Ambulatory Visit: Payer: Self-pay | Admitting: Internal Medicine

## 2019-05-09 DIAGNOSIS — R590 Localized enlarged lymph nodes: Secondary | ICD-10-CM

## 2019-05-22 ENCOUNTER — Other Ambulatory Visit: Payer: Self-pay

## 2019-05-22 ENCOUNTER — Ambulatory Visit
Admission: RE | Admit: 2019-05-22 | Discharge: 2019-05-22 | Disposition: A | Discharge status: Home / Self Care | Payer: Managed Care, Other (non HMO) | Source: Ambulatory Visit | Attending: Internal Medicine | Admitting: Internal Medicine

## 2019-05-22 DIAGNOSIS — R590 Localized enlarged lymph nodes: Secondary | ICD-10-CM | POA: Insufficient documentation

## 2019-05-22 MED ORDER — IOHEXOL 300 MG/ML  SOLN
75.0000 mL | Freq: Once | INTRAMUSCULAR | Status: AC | PRN
  Administered 2019-05-22: 75 mL via INTRAVENOUS

## 2019-05-30 ENCOUNTER — Other Ambulatory Visit: Payer: Self-pay | Admitting: Internal Medicine

## 2019-05-30 ENCOUNTER — Other Ambulatory Visit: Payer: Self-pay | Admitting: Obstetrics and Gynecology

## 2019-05-30 DIAGNOSIS — Z1231 Encounter for screening mammogram for malignant neoplasm of breast: Secondary | ICD-10-CM

## 2019-06-28 ENCOUNTER — Ambulatory Visit
Admission: RE | Admit: 2019-06-28 | Discharge: 2019-06-28 | Disposition: A | Discharge status: Home / Self Care | Payer: Managed Care, Other (non HMO) | Source: Ambulatory Visit | Attending: Internal Medicine | Admitting: Internal Medicine

## 2019-06-28 DIAGNOSIS — Z1231 Encounter for screening mammogram for malignant neoplasm of breast: Secondary | ICD-10-CM | POA: Diagnosis present

## 2019-09-21 IMAGING — MG DIGITAL DIAGNOSTIC BILATERAL MAMMOGRAM WITH TOMO AND CAD
6 of 10 series · 6 of 26 positions shown · non-contrast
Comparison: Previous exam(s).

CLINICAL DATA: Short-term follow-up for right breast
calcifications, initially assessed in February 2016.Patient was also
found to have left breast cysts during that diagnostic study.

EXAM:
DIGITAL DIAGNOSTIC BILATERAL MAMMOGRAM WITH CAD AND TOMO

[R CC]
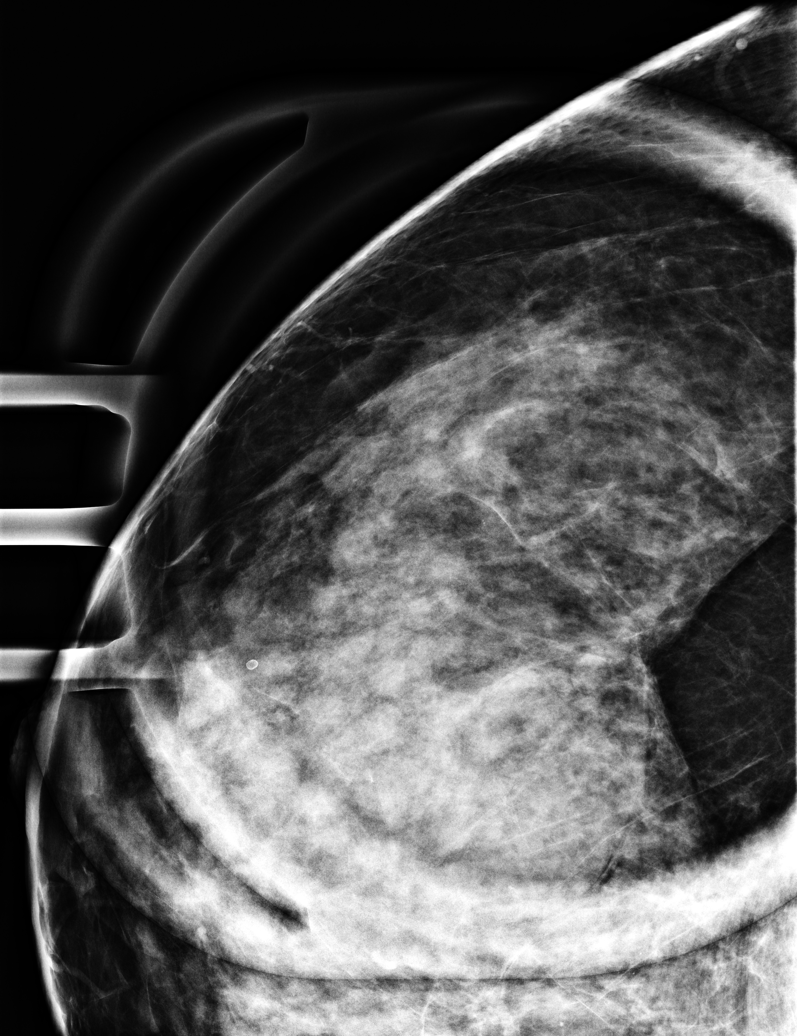

[R ML]
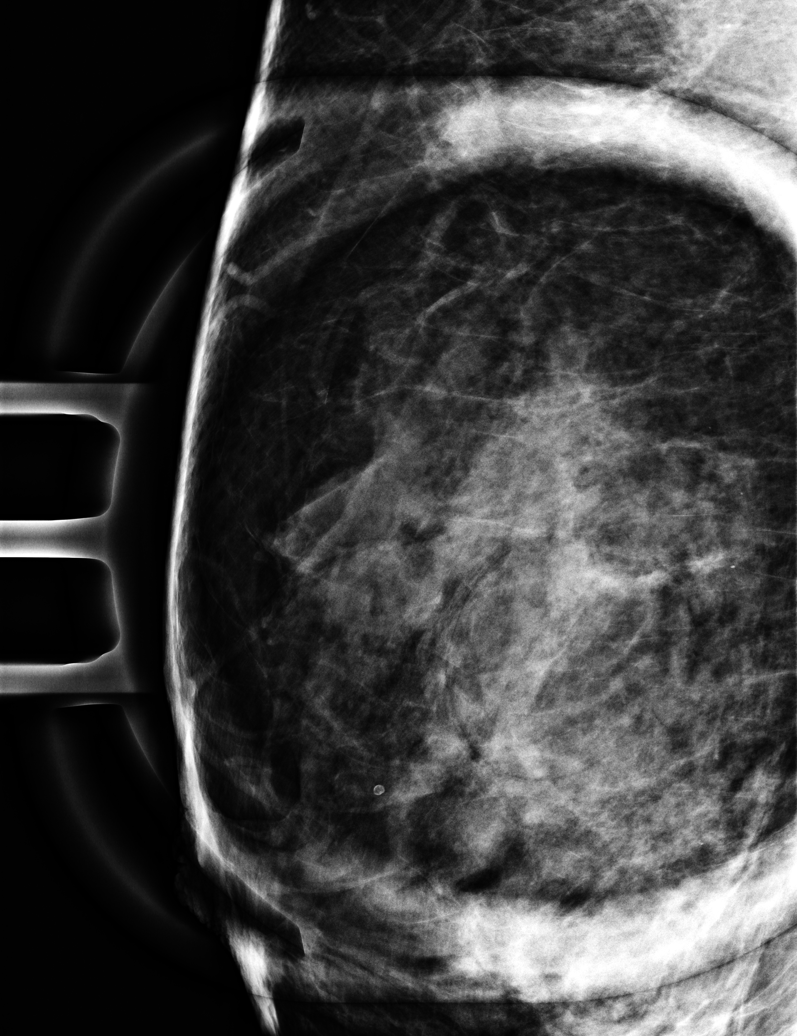

[L MLO synth-2D]
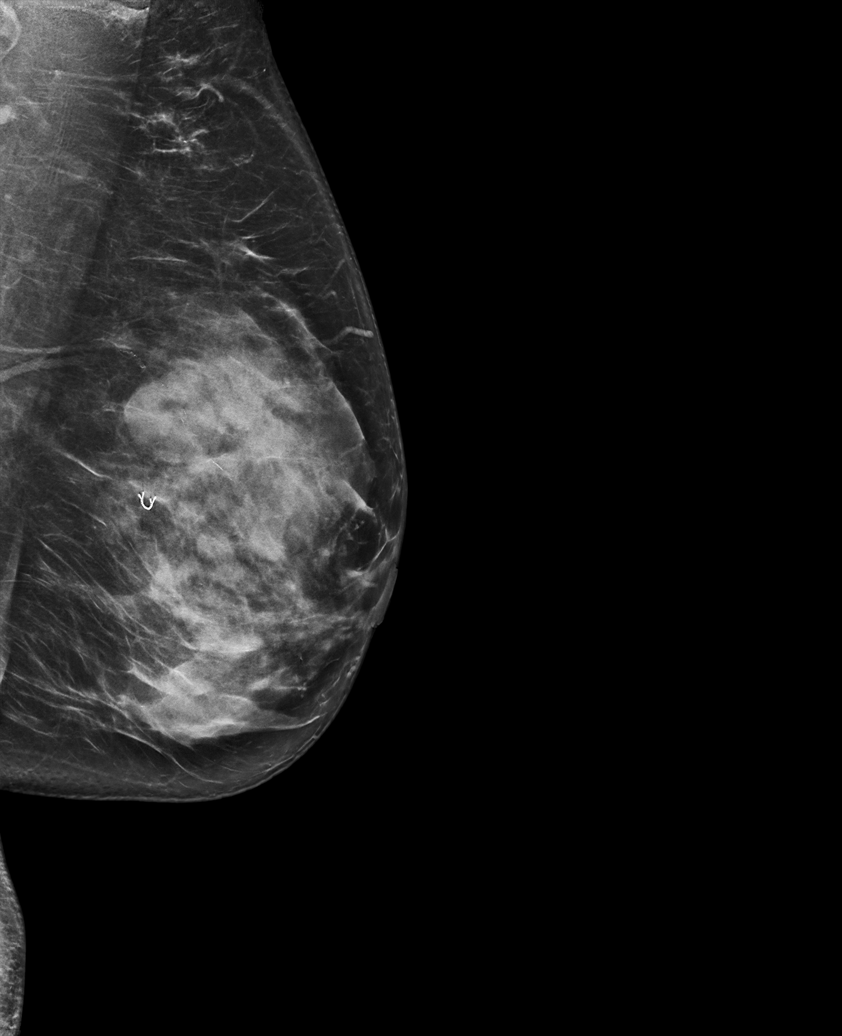

[L CC synth-2D]
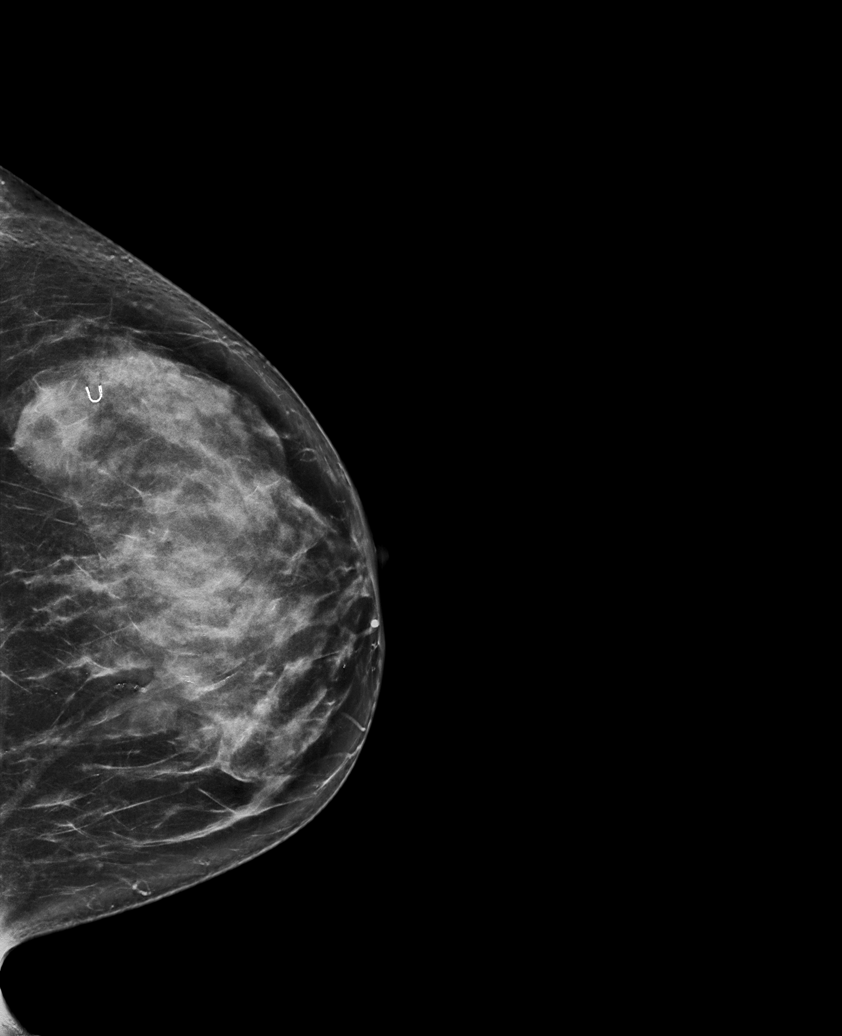

[R MLO synth-2D]
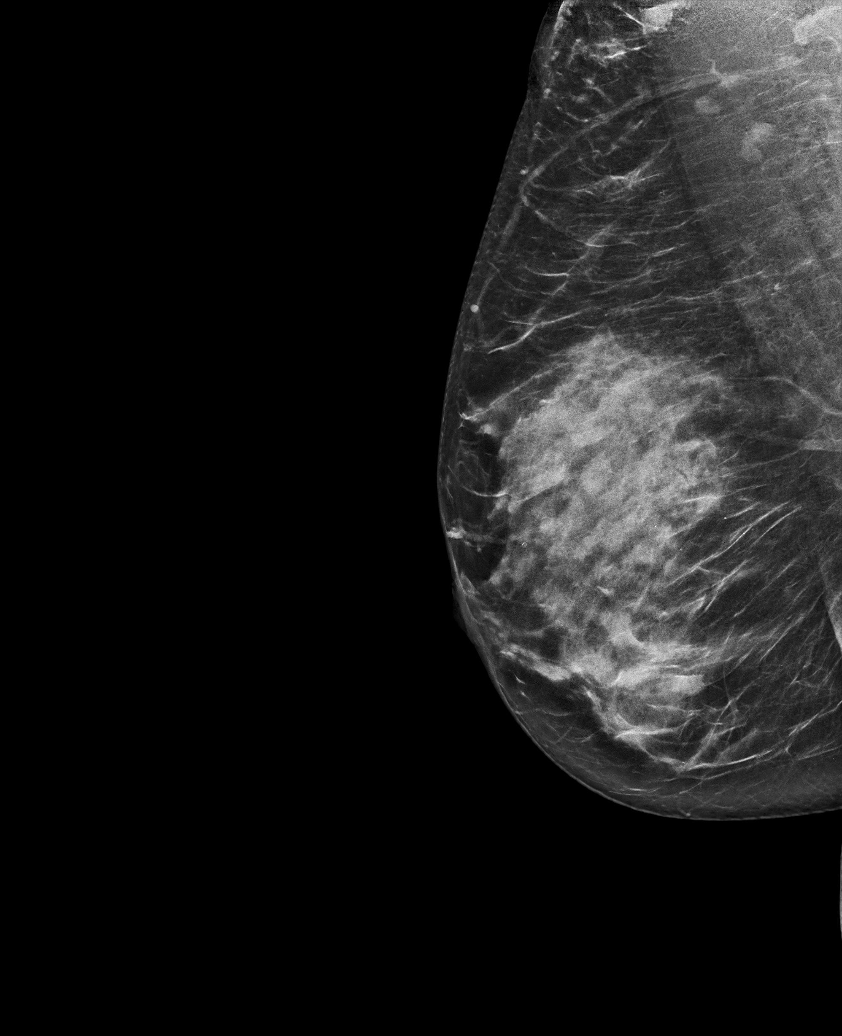

[R CC synth-2D]
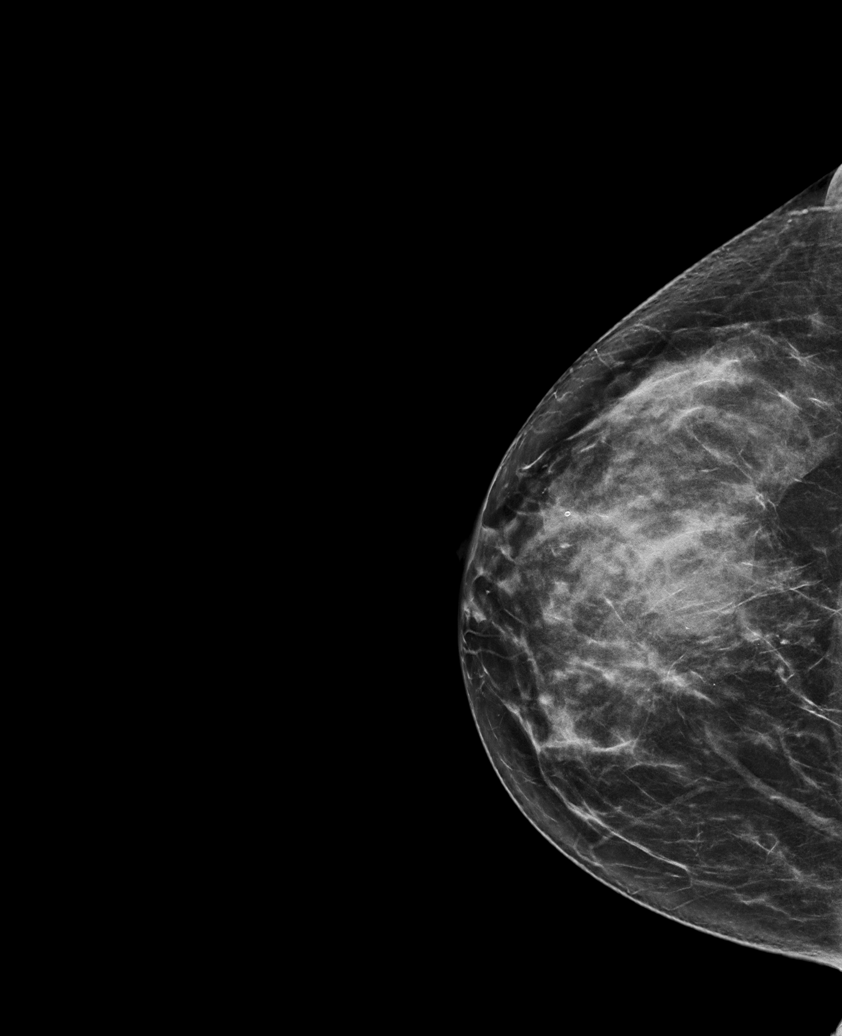

[6 of 26 positions shown; findings below may reference images not displayed]

ACR Breast Density Category c: The breast tissue is heterogeneously
dense, which may obscure small masses.
FINDINGS: The calcifications in the upper outer right breast have decreased in
number and conspicuity, only visualized on the cc magnification
view.

There are no new or suspicious calcifications. Benign circumscribed
bilateral breast masses consistent with cysts are noted. There are
no suspicious masses, and no areas of architectural distortion.

Mammographic images were processed with CAD.
IMPRESSION: 1. No evidence of breast malignancy.
2. Subtle benign right breast calcifications. Benign bilateral
breast cysts.

RECOMMENDATION:
Screening mammogram in one year.(Code:09-3-FBR)

I have discussed the findings and recommendations with the patient.
Results were also provided in writing at the conclusion of the
visit. If applicable, a reminder letter will be sent to the patient
regarding the next appointment.

BI-RADS CATEGORY  2: Benign.

## 2019-09-28 ENCOUNTER — Other Ambulatory Visit: Payer: Self-pay | Admitting: Neurology

## 2019-09-28 DIAGNOSIS — M5412 Radiculopathy, cervical region: Secondary | ICD-10-CM

## 2019-09-28 DIAGNOSIS — M79602 Pain in left arm: Secondary | ICD-10-CM

## 2019-10-19 ENCOUNTER — Other Ambulatory Visit: Payer: Self-pay

## 2019-10-19 ENCOUNTER — Ambulatory Visit
Admission: RE | Admit: 2019-10-19 | Discharge: 2019-10-19 | Disposition: A | Discharge status: Home / Self Care | Payer: Managed Care, Other (non HMO) | Source: Ambulatory Visit | Attending: Neurology | Admitting: Neurology

## 2019-10-19 DIAGNOSIS — M79602 Pain in left arm: Secondary | ICD-10-CM

## 2019-10-19 DIAGNOSIS — M5412 Radiculopathy, cervical region: Secondary | ICD-10-CM

## 2020-04-16 ENCOUNTER — Other Ambulatory Visit: Payer: Self-pay | Admitting: Obstetrics & Gynecology

## 2020-04-16 DIAGNOSIS — N6320 Unspecified lump in the left breast, unspecified quadrant: Secondary | ICD-10-CM

## 2020-04-26 ENCOUNTER — Other Ambulatory Visit: Payer: Self-pay

## 2020-04-26 ENCOUNTER — Ambulatory Visit
Admission: RE | Admit: 2020-04-26 | Discharge: 2020-04-26 | Disposition: A | Discharge status: Home / Self Care | Payer: Managed Care, Other (non HMO) | Source: Ambulatory Visit | Attending: Obstetrics & Gynecology | Admitting: Obstetrics & Gynecology

## 2020-04-26 DIAGNOSIS — N6320 Unspecified lump in the left breast, unspecified quadrant: Secondary | ICD-10-CM | POA: Insufficient documentation

## 2020-10-31 ENCOUNTER — Encounter: Payer: Self-pay | Admitting: *Deleted

## 2020-11-01 ENCOUNTER — Encounter
Admission: RE | Disposition: A | Discharge status: Home / Self Care | Payer: Self-pay | Source: Home / Self Care | Attending: Gastroenterology

## 2020-11-01 ENCOUNTER — Ambulatory Visit: Payer: Managed Care, Other (non HMO) | Admitting: Anesthesiology

## 2020-11-01 ENCOUNTER — Other Ambulatory Visit: Payer: Self-pay

## 2020-11-01 ENCOUNTER — Encounter: Payer: Self-pay | Admitting: *Deleted

## 2020-11-01 ENCOUNTER — Ambulatory Visit
Admission: RE | Admit: 2020-11-01 | Discharge: 2020-11-01 | Disposition: A | Discharge status: Home / Self Care | Payer: Managed Care, Other (non HMO) | Attending: Gastroenterology | Admitting: Gastroenterology

## 2020-11-01 DIAGNOSIS — Z1211 Encounter for screening for malignant neoplasm of colon: Secondary | ICD-10-CM | POA: Diagnosis not present

## 2020-11-01 DIAGNOSIS — Z888 Allergy status to other drugs, medicaments and biological substances status: Secondary | ICD-10-CM | POA: Insufficient documentation

## 2020-11-01 DIAGNOSIS — Z881 Allergy status to other antibiotic agents status: Secondary | ICD-10-CM | POA: Diagnosis not present

## 2020-11-01 DIAGNOSIS — Z880 Allergy status to penicillin: Secondary | ICD-10-CM | POA: Insufficient documentation

## 2020-11-01 DIAGNOSIS — Z7982 Long term (current) use of aspirin: Secondary | ICD-10-CM | POA: Diagnosis not present

## 2020-11-01 DIAGNOSIS — K640 First degree hemorrhoids: Secondary | ICD-10-CM | POA: Insufficient documentation

## 2020-11-01 DIAGNOSIS — K5730 Diverticulosis of large intestine without perforation or abscess without bleeding: Secondary | ICD-10-CM | POA: Insufficient documentation

## 2020-11-01 HISTORY — PX: COLONOSCOPY WITH PROPOFOL: SHX5780

## 2020-11-01 SURGERY — COLONOSCOPY WITH PROPOFOL
Anesthesia: General

## 2020-11-01 MED ORDER — PHENYLEPHRINE HCL (PRESSORS) 10 MG/ML IV SOLN
INTRAVENOUS | Status: AC
  Filled 2020-11-01: qty 1

## 2020-11-01 MED ORDER — PROPOFOL 10 MG/ML IV BOLUS
INTRAVENOUS | Status: DC | PRN
  Administered 2020-11-01: 20 mg via INTRAVENOUS
  Administered 2020-11-01: 60 mg via INTRAVENOUS

## 2020-11-01 MED ORDER — PROPOFOL 500 MG/50ML IV EMUL
INTRAVENOUS | Status: DC | PRN
  Administered 2020-11-01: 125 ug/kg/min via INTRAVENOUS

## 2020-11-01 MED ORDER — PROPOFOL 500 MG/50ML IV EMUL
INTRAVENOUS | Status: AC
  Filled 2020-11-01: qty 50

## 2020-11-01 MED ORDER — SODIUM CHLORIDE 0.9 % IV SOLN: INTRAVENOUS | Status: DC

## 2020-11-01 MED ORDER — LIDOCAINE HCL (CARDIAC) PF 100 MG/5ML IV SOSY
PREFILLED_SYRINGE | INTRAVENOUS | Status: DC | PRN
Start: 2020-11-01 — End: 2020-11-01
  Administered 2020-11-01: 50 mg via INTRAVENOUS

## 2020-11-01 NOTE — Interval H&P Note (Signed)
History and Physical Interval Note:  11/01/2020 7:46 AM  Susan Cummings  has presented today for surgery, with the diagnosis of SCREEN.  The various methods of treatment have been discussed with the patient and family. After consideration of risks, benefits and other options for treatment, the patient has consented to  Procedure(s) with comments: COLONOSCOPY WITH PROPOFOL (N/A) - [facility] as a surgical intervention.  The patient's history has been reviewed, patient examined, no change in status, stable for surgery.  I have reviewed the patient's chart and labs.  Questions were answered to the patient's satisfaction.     Regis Bill  Ok to proceed with colonoscopy

## 2020-11-01 NOTE — Transfer of Care (Signed)
Immediate Anesthesia Transfer of Care Note  Patient: MILISSA FESPERMAN  Procedure(s) Performed: COLONOSCOPY WITH PROPOFOL  Patient Location: PACU and Endoscopy Unit  Anesthesia Type:General  Level of Consciousness: drowsy  Airway & Oxygen Therapy: Patient Spontanous Breathing  Post-op Assessment: Report given to RN and Post -op Vital signs reviewed and stable  Post vital signs: Reviewed and stable  Last Vitals:  Vitals Value Taken Time  BP 142/96 11/01/20 0810  Temp    Pulse 87 11/01/20 0810  Resp 14 11/01/20 0810  SpO2 100 % 11/01/20 0810  Vitals shown include unvalidated device data.  Last Pain:  Vitals:   11/01/20 0704  TempSrc: Temporal  PainSc: 0-No pain         Complications: No notable events documented.

## 2020-11-01 NOTE — Anesthesia Preprocedure Evaluation (Signed)
Anesthesia Evaluation  Patient identified by MRN, date of birth, ID band Patient awake    Reviewed: Allergy & Precautions, H&P , NPO status , Patient's Chart, lab work & pertinent test results  History of Anesthesia Complications Negative for: history of anesthetic complications  Airway Mallampati: II  TM Distance: >3 FB Neck ROM: full    Dental  (+) Poor Dentition, Implants, Chipped   Pulmonary neg pulmonary ROS, neg shortness of breath,    Pulmonary exam normal breath sounds clear to auscultation       Cardiovascular Exercise Tolerance: Good (-) angina(-) DOE Normal cardiovascular exam Rhythm:regular Rate:Normal     Neuro/Psych  Headaches, Anxiety TIACVA (Minimal L side numbness), Residual Symptoms negative psych ROS   GI/Hepatic Neg liver ROS, PUD, Bowel prep,neg GERD  ,  Endo/Other  negative endocrine ROS  Renal/GU Renal disease  negative genitourinary   Musculoskeletal   Abdominal   Peds  Hematology negative hematology ROS (+) anemia ,   Anesthesia Other Findings   Hemorrhoid                                        Allergic rhinitis, seasonal                        Gastrointestinal ulcer due to Helicobacter pyl*    Calculus of kidney                          Bloodgood disease                                  Cephalalgia                                        Infection due to salmonella                   Anemia                                                       Cerebral vascular accident                            Comment:TIA-2011 Past Surgical History:   LITHOTRIPSY                                                   CESAREAN SECTION                                              BREAST BIOPSY                                   Left 08/16/12  Comment:negative   TUBAL LIGATION                                                NOVASURE ABLATION                                            BMI    Body Mass  Index   24.45 kg/m 2      Reproductive/Obstetrics negative OB ROS                            Anesthesia Physical  Anesthesia Plan  ASA: 3  Anesthesia Plan: General   Post-op Pain Management:    Induction: Intravenous  PONV Risk Score and Plan: 2 and Propofol infusion  Airway Management Planned: Natural Airway and Nasal Cannula  Additional Equipment:   Intra-op Plan:   Post-operative Plan:   Informed Consent: I have reviewed the patients History and Physical, chart, labs and discussed the procedure including the risks, benefits and alternatives for the proposed anesthesia with the patient or authorized representative who has indicated his/her understanding and acceptance.     Dental Advisory Given  Plan Discussed with: Anesthesiologist, CRNA and Surgeon  Anesthesia Plan Comments:        Anesthesia Quick Evaluation

## 2020-11-01 NOTE — Anesthesia Postprocedure Evaluation (Signed)
Anesthesia Post Note  Patient: Susan Cummings  Procedure(s) Performed: COLONOSCOPY WITH PROPOFOL  Patient location during evaluation: Phase II Anesthesia Type: General Level of consciousness: awake and alert, awake and oriented Pain management: pain level controlled Vital Signs Assessment: post-procedure vital signs reviewed and stable Respiratory status: spontaneous breathing, nonlabored ventilation and respiratory function stable Cardiovascular status: blood pressure returned to baseline and stable Postop Assessment: no apparent nausea or vomiting Anesthetic complications: no   No notable events documented.   Last Vitals:  Vitals:   11/01/20 0824 11/01/20 0831  BP:  (!) 152/90  Pulse: 77 75  Resp:  14  Temp:    SpO2: 100% 100%    Last Pain:  Vitals:   11/01/20 0810  TempSrc: Temporal  PainSc:                  Manfred Arch

## 2020-11-01 NOTE — H&P (Signed)
Outpatient short stay form Pre-procedure 11/01/2020  Regis Bill, MD  Primary Physician: Barbette Reichmann, MD  Reason for visit:  Screening colonoscopy  History of present illness:   50 y/o lady with no significant medical history here for screening colonoscopy. No blood thinners. No family history of GI malignancies. History of cholecystectomy and a c-section.    Current Facility-Administered Medications:    0.9 %  sodium chloride infusion, , Intravenous, Continuous, Julya Alioto, Rossie Muskrat, MD, Last Rate: 20 mL/hr at 11/01/20 0722, New Bag at 11/01/20 0722  Medications Prior to Admission  Medication Sig Dispense Refill Last Dose   acetaminophen (TYLENOL) 500 MG tablet Take 1,000 mg by mouth every 6 (six) hours as needed for moderate pain.    10/31/2020   cyclobenzaprine (FLEXERIL) 10 MG tablet Take 10 mg by mouth 3 (three) times daily as needed for muscle spasms.   Past Month   ALPRAZolam (XANAX) 0.5 MG tablet Take 0.5 mg by mouth at bedtime as needed for anxiety or sleep.   2 10/29/2020   aspirin EC 81 MG tablet Take 81 mg by mouth daily.   10/29/2020   Cyanocobalamin 500 MCG TBDP Take 1,000 mcg by mouth daily.   10/29/2020   dicyclomine (BENTYL) 10 MG capsule Take 10 mg by mouth daily as needed for spasms.   4 10/29/2020   HYDROcodone-acetaminophen (NORCO) 5-325 MG tablet Take 1-2 tablets by mouth every 4 (four) hours as needed for moderate pain. (Patient not taking: Reported on 11/01/2020) 12 tablet 0 Not Taking   Multiple Vitamin (MULTI-VITAMINS) TABS Take 1 tablet by mouth daily.    10/29/2020   tamsulosin (FLOMAX) 0.4 MG CAPS capsule Take 1 capsule (0.4 mg total) by mouth daily. (Patient not taking: No sig reported) 30 capsule 0 Not Taking     Allergies  Allergen Reactions   Simvastatin Other (See Comments)    bruising   Cefdinir Rash   Penicillins Rash    Has patient had a PCN reaction causing immediate rash, facial/tongue/throat swelling, SOB or lightheadedness with hypotension:  No Has patient had a PCN reaction causing severe rash involving mucus membranes or skin necrosis: No Has patient had a PCN reaction that required hospitalization: No Has patient had a PCN reaction occurring within the last 10 years: No If all of the above answers are "NO", then may proceed with Cephalosporin use.      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    Physical Exam  Gen: Alert, oriented. Appears stated age.  HEENT: PERRLA. Lungs: No respiratory distress CV: RRR Abd: soft, benign, no masses Ext: No edema    Planned procedures: Proceed with colonoscopy. The patient understands the nature of the planned procedure, indications, risks, alternatives and potential complications including but not limited to bleeding, infection, perforation, damage to internal organs and possible oversedation/side effects from anesthesia. The patient agrees and gives consent to proceed.  Please refer to procedure notes for findings, recommendations and patient disposition/instructions.     Regis Bill, MD [facility]

## 2020-11-01 NOTE — Op Note (Signed)
[facility] Patient Name: Susan Cummings Procedure Date: 11/01/2020 7:21 AM MRN: [id number] Account #: [id number] Date of Birth: 1970-08-05 Admit Type: Outpatient Age: 50 Room: [facility] ENDO ROOM 3 Gender: Female Note Status: Finalized Instrument Name: Jasper Riling [id number] Procedure:             Colonoscopy Indications:           Screening for colorectal malignant neoplasm Providers:             Andrey Farmer MD, MD Referring MD:          Tracie Harrier, MD (Referring MD) Medicines:             Monitored Anesthesia Care Complications:         No immediate complications. Procedure:             Pre-Anesthesia Assessment:                        - Prior to the procedure, a History and Physical was                         performed, and patient medications and allergies were                         reviewed. The patient is competent. The risks and                         benefits of the procedure and the sedation options and                         risks were discussed with the patient. All questions                         were answered and informed consent was obtained.                         Patient identification and proposed procedure were                         verified by the physician, the nurse, the anesthetist                         and the technician in the endoscopy suite. Mental                         Status Examination: alert and oriented. Airway                         Examination: normal oropharyngeal airway and neck                         mobility. Respiratory Examination: clear to                         auscultation. CV Examination: normal. Prophylactic                         Antibiotics: The patient does not require prophylactic  antibiotics. Prior Anticoagulants: The patient has                         taken no previous anticoagulant or antiplatelet                         agents. ASA Grade Assessment:  I - A normal, healthy                         patient. After reviewing the risks and benefits, the                         patient was deemed in satisfactory condition to                         undergo the procedure. The anesthesia plan was to use                         monitored anesthesia care (MAC). Immediately prior to                         administration of medications, the patient was                         re-assessed for adequacy to receive sedatives. The                         heart rate, respiratory rate, oxygen saturations,                         blood pressure, adequacy of pulmonary ventilation, and                         response to care were monitored throughout the                         procedure. The physical status of the patient was                         re-assessed after the procedure.                        After obtaining informed consent, the colonoscope was                         passed under direct vision. Throughout the procedure,                         the patient's blood pressure, pulse, and oxygen                         saturations were monitored continuously. The                         Colonoscope was introduced through the anus and                         advanced to the the cecum, identified by appendiceal  orifice and ileocecal valve. The colonoscopy was                         performed without difficulty. The patient tolerated                         the procedure well. The quality of the bowel                         preparation was good. Findings:      The perianal and digital rectal examinations were normal.      A single small-mouthed diverticulum was found in the descending colon.      Internal hemorrhoids were found during retroflexion. The hemorrhoids       were Grade I (internal hemorrhoids that do not prolapse).      The exam was otherwise without abnormality on direct and retroflexion        views. Impression:            - Diverticulosis in the descending colon.                        - Internal hemorrhoids.                        - The examination was otherwise normal on direct and                         retroflexion views.                        - No specimens collected. Recommendation:        - Discharge patient to home.                        - Resume previous diet.                        - Continue present medications.                        - Repeat colonoscopy in 10 years for screening                         purposes.                        - Return to referring physician as previously                         scheduled. Procedure Code(s):     --- Professional ---                        J2878, Colorectal cancer screening; colonoscopy on                         individual not meeting criteria for high risk Diagnosis Code(s):     --- Professional ---                        Z12.11, Encounter for screening for malignant neoplasm  of colon                        K64.0, First degree hemorrhoids                        K57.30, Diverticulosis of large intestine without                         perforation or abscess without bleeding CPT copyright 2019 American Medical Association. All rights reserved. The codes documented in this report are preliminary and upon coder review may  be revised to meet current compliance requirements. Andrey Farmer MD, MD 11/01/2020 8:10:45 AM Number of Addenda: 0 Note Initiated On: 11/01/2020 7:21 AM Scope Withdrawal Time: 0 hours 7 minutes 42 seconds  Total Procedure Duration: 0 hours 14 minutes 24 seconds  Estimated Blood Loss:  Estimated blood loss: none.      [facility]

## 2020-11-04 ENCOUNTER — Encounter: Payer: Self-pay | Admitting: Gastroenterology

## 2021-01-10 ENCOUNTER — Ambulatory Visit (INDEPENDENT_AMBULATORY_CARE_PROVIDER_SITE_OTHER): Payer: Managed Care, Other (non HMO) | Admitting: Urology

## 2021-01-10 ENCOUNTER — Other Ambulatory Visit: Payer: Self-pay

## 2021-01-10 ENCOUNTER — Encounter: Payer: Self-pay | Admitting: Urology

## 2021-01-10 VITALS — BP 142/84 | HR 114 | Ht 62.0 in | Wt 156.0 lb

## 2021-01-10 DIAGNOSIS — R350 Frequency of micturition: Secondary | ICD-10-CM

## 2021-01-10 DIAGNOSIS — R3989 Other symptoms and signs involving the genitourinary system: Secondary | ICD-10-CM | POA: Diagnosis not present

## 2021-01-10 DIAGNOSIS — R102 Pelvic and perineal pain: Secondary | ICD-10-CM | POA: Diagnosis not present

## 2021-01-10 DIAGNOSIS — R3915 Urgency of urination: Secondary | ICD-10-CM

## 2021-01-10 DIAGNOSIS — R109 Unspecified abdominal pain: Secondary | ICD-10-CM

## 2021-01-10 LAB — URINALYSIS, COMPLETE
Bilirubin, UA: NEGATIVE
Glucose, UA: NEGATIVE
Ketones, UA: NEGATIVE
Leukocytes,UA: NEGATIVE
Nitrite, UA: NEGATIVE
Protein,UA: NEGATIVE
RBC, UA: NEGATIVE
Specific Gravity, UA: 1.01 (ref 1.005–1.030)
Urobilinogen, Ur: 0.2 mg/dL (ref 0.2–1.0)
pH, UA: 6 (ref 5.0–7.5)

## 2021-01-10 LAB — MICROSCOPIC EXAMINATION
Bacteria, UA: NONE SEEN
Epithelial Cells (non renal): NONE SEEN /hpf (ref 0–10)

## 2021-01-10 MED ORDER — GEMTESA 75 MG PO TABS: 75.0000 mg | ORAL_TABLET | Freq: Every day | ORAL | 0 refills | Status: AC

## 2021-01-10 NOTE — Progress Notes (Signed)
01/10/2021 8:55 AM   Albesa Seen 07/24/1970 [id number]  Referring provider: Barbette Reichmann, MD [address] [facility] [place],  [place] [address]  Chief Complaint  Patient presents with   Dysuria    HPI: Susan Cummings is a 50 y.o. female who recently saw Dr. Dalbert Garnet and had complaints of urinary frequency and pelvic pain  In September 2022 she had onset of urinary frequency, urgency with occasional urge incontinence.  She notes a pelvic burning sensation and achiness in the pelvis.  She is also had mild left flank pain.  Initially thought she may be having side effects from blood pressure medication that she started in September Urinalysis was unremarkable and urine culture negative Saw Dr. 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 09/17/2014   TIA (transient ischemic attack) 2011    Surgical History: Past Surgical History:  Procedure Laterality Date   BREAST BIOPSY Left 08/16/12   negative   CESAREAN SECTION     CHOLECYSTECTOMY N/A 03/14/2015   Procedure: LAPAROSCOPIC CHOLECYSTECTOMY;  Surgeon: Nadeen Landau, MD;  Location: [facility] ORS;  Service: General;  Laterality: N/A;   COLONOSCOPY WITH PROPOFOL N/A 11/01/2020   Procedure: COLONOSCOPY WITH PROPOFOL;  Surgeon: Regis Bill, MD;  Location: [facility] ENDOSCOPY;  Service: Endoscopy;  Laterality: N/A;  [facility]   LITHOTRIPSY     NOVASURE ABLATION     TUBAL LIGATION      Home Medications:  Allergies  as of 01/10/2021       Reactions   Simvastatin Other (See Comments)   bruising   Cefdinir Rash   Penicillins Rash   Has patient had a PCN reaction causing immediate rash, facial/tongue/throat swelling, SOB or lightheadedness with hypotension: No Has patient had a PCN reaction causing severe rash involving mucus membranes or skin necrosis: No Has patient had a PCN reaction that required hospitalization: No Has patient had a PCN reaction occurring within the last 10 years: No If all of the above answers are "NO", then may proceed with Cephalosporin use.        Medication List        Accurate as of January 10, 2021  8:55 AM. If you have any questions, ask your nurse or doctor.          STOP taking these medications    Cyanocobalamin 500 MCG Tbdp Stopped by: Riki Altes, MD   HYDROcodone-acetaminophen 5-325 MG tablet Commonly known as: Norco Stopped by: Riki Altes, MD   tamsulosin 0.4 MG Caps capsule Commonly known as: FLOMAX Stopped by: Riki Altes, MD       TAKE these medications    acetaminophen 500 MG tablet Commonly known as: TYLENOL Take 1,000 mg by mouth every 6 (six) hours as needed for moderate pain.   ALPRAZolam 0.5 MG tablet Commonly known as: XANAX Take 0.5 mg by mouth at bedtime as needed for anxiety or sleep.   amLODipine-benazepril 5-10 MG capsule Commonly known as: LOTREL Take 1 capsule  by mouth daily.   aspirin EC 81 MG tablet Take 81 mg by mouth daily.   cyclobenzaprine 10 MG tablet Commonly known as: FLEXERIL Take 10 mg by mouth 3 (three) times daily as needed for muscle spasms.   dicyclomine 10 MG capsule Commonly known as: BENTYL Take 10 mg by mouth daily as needed for spasms.   Gemtesa 75 MG Tabs Generic drug: Vibegron Take 75 mg by mouth daily. Started by: Riki Altes, MD   Multi-Vitamins Tabs Take 1 tablet by mouth daily.   traMADol 50 MG tablet Commonly known as: ULTRAM Take 50 mg by mouth daily as  needed.        Allergies:  Allergies  Allergen Reactions   Simvastatin Other (See Comments)    bruising   Cefdinir Rash   Penicillins Rash    Has patient had a PCN reaction causing immediate rash, facial/tongue/throat swelling, SOB or lightheadedness with hypotension: No Has patient had a PCN reaction causing severe rash involving mucus membranes or skin necrosis: No Has patient had a PCN reaction that required hospitalization: No Has patient had a PCN reaction occurring within the last 10 years: No If all of the above answers are "NO", then may proceed with Cephalosporin use.     Family History: Family History  Problem Relation Age of Onset   Hypertension Mother    Diabetes Maternal Grandmother    Breast cancer Neg Hx     Social History:  reports that she has never smoked. She has never used smokeless tobacco. She reports current alcohol use. She reports that she does not use drugs.   Physical Exam: BP (!) 142/84   Pulse (!) 114   Ht 5\' 2"  (1.575 m)   Wt 156 lb (70.8 kg)   BMI 28.53 kg/m   Constitutional:  Alert and oriented, No acute distress. HEENT: [place] AT, moist mucus membranes.  Trachea midline, no masses. Cardiovascular: No clubbing, cyanosis, or edema. Respiratory: Normal respiratory effort, no increased work of breathing. Psychiatric: Normal mood and affect.  Laboratory Data:  Urinalysis 01/10/2021: Dipstick/microscopy negative   Assessment & Plan:   50 y.o. female with 48-month history of storage related voiding symptoms, pelvic pain and left flank pain Urinalysis clear and recent urine culture negative History of recurrent stone disease and have initially recommended a stone protocol CT of the pelvis to evaluate for a possible distal ureteral calculus Given samples Gemtesa 75 mg daily for her storage related voiding symptoms She will be contacted with her CT results and further recommendations   3-month, MD  [facility] [address], [address] [place], [place] [phone]

## 2021-02-05 ENCOUNTER — Ambulatory Visit
Admission: RE | Admit: 2021-02-05 | Discharge: 2021-02-05 | Disposition: A | Discharge status: Home / Self Care | Payer: Managed Care, Other (non HMO) | Source: Ambulatory Visit | Attending: Urology | Admitting: Urology

## 2021-02-05 ENCOUNTER — Other Ambulatory Visit: Payer: Self-pay

## 2021-02-05 DIAGNOSIS — R102 Pelvic and perineal pain: Secondary | ICD-10-CM | POA: Diagnosis present

## 2021-02-05 DIAGNOSIS — R3915 Urgency of urination: Secondary | ICD-10-CM | POA: Diagnosis present

## 2021-02-05 DIAGNOSIS — R350 Frequency of micturition: Secondary | ICD-10-CM | POA: Insufficient documentation

## 2021-02-05 DIAGNOSIS — R109 Unspecified abdominal pain: Secondary | ICD-10-CM

## 2021-02-06 ENCOUNTER — Encounter: Payer: Self-pay | Admitting: *Deleted

## 2021-05-14 ENCOUNTER — Other Ambulatory Visit: Payer: Self-pay | Admitting: Internal Medicine

## 2021-05-14 DIAGNOSIS — Z1231 Encounter for screening mammogram for malignant neoplasm of breast: Secondary | ICD-10-CM

## 2021-06-25 ENCOUNTER — Ambulatory Visit
Admission: RE | Admit: 2021-06-25 | Discharge: 2021-06-25 | Disposition: A | Discharge status: Home / Self Care | Payer: Managed Care, Other (non HMO) | Source: Ambulatory Visit | Attending: Internal Medicine | Admitting: Internal Medicine

## 2021-06-25 DIAGNOSIS — Z1231 Encounter for screening mammogram for malignant neoplasm of breast: Secondary | ICD-10-CM | POA: Insufficient documentation

## 2021-06-30 ENCOUNTER — Ambulatory Visit
Admission: RE | Admit: 2021-06-30 | Discharge: 2021-06-30 | Disposition: A | Discharge status: Home / Self Care | Payer: Managed Care, Other (non HMO) | Source: Ambulatory Visit | Attending: Internal Medicine | Admitting: Internal Medicine

## 2021-06-30 ENCOUNTER — Other Ambulatory Visit: Payer: Self-pay | Admitting: Internal Medicine

## 2021-06-30 DIAGNOSIS — N63 Unspecified lump in unspecified breast: Secondary | ICD-10-CM | POA: Insufficient documentation

## 2021-06-30 DIAGNOSIS — R928 Other abnormal and inconclusive findings on diagnostic imaging of breast: Secondary | ICD-10-CM

## 2022-06-03 ENCOUNTER — Other Ambulatory Visit: Payer: Self-pay | Admitting: Internal Medicine

## 2022-06-03 DIAGNOSIS — Z1231 Encounter for screening mammogram for malignant neoplasm of breast: Secondary | ICD-10-CM

## 2022-12-03 ENCOUNTER — Other Ambulatory Visit: Payer: Self-pay | Admitting: Internal Medicine

## 2022-12-03 DIAGNOSIS — Z1231 Encounter for screening mammogram for malignant neoplasm of breast: Secondary | ICD-10-CM

## 2023-08-06 ENCOUNTER — Ambulatory Visit
Admission: RE | Admit: 2023-08-06 | Discharge: 2023-08-06 | Disposition: A | Discharge status: Home / Self Care | Source: Ambulatory Visit | Attending: Internal Medicine | Admitting: Internal Medicine

## 2023-08-06 DIAGNOSIS — Z1231 Encounter for screening mammogram for malignant neoplasm of breast: Secondary | ICD-10-CM | POA: Insufficient documentation

## 2023-08-13 ENCOUNTER — Encounter

## 2024-01-18 ENCOUNTER — Other Ambulatory Visit: Payer: Self-pay | Admitting: Gastroenterology

## 2024-01-18 ENCOUNTER — Ambulatory Visit
Admission: RE | Admit: 2024-01-18 | Discharge: 2024-01-18 | Disposition: A | Discharge status: Home / Self Care | Source: Ambulatory Visit | Attending: Gastroenterology | Admitting: Gastroenterology

## 2024-01-18 DIAGNOSIS — R1031 Right lower quadrant pain: Secondary | ICD-10-CM

## 2024-01-18 MED ORDER — IOHEXOL 300 MG/ML  SOLN
100.0000 mL | Freq: Once | INTRAMUSCULAR | Status: AC | PRN
  Administered 2024-01-18: 100 mL via INTRAVENOUS
